# Patient Record
Sex: Male | Born: 1935 | Race: White | Hispanic: No | Marital: Married | Smoking: Never smoker
Health system: Southern US, Community
[De-identification: ages and names within clinical notes are randomized; demographics above are authoritative.]

## PROBLEM LIST (undated history)

## (undated) DIAGNOSIS — I10 Essential (primary) hypertension: Secondary | ICD-10-CM

## (undated) HISTORY — PX: PROSTATECTOMY: SHX69

## (undated) HISTORY — PX: LIGAMENT REPAIR: SHX5444

## (undated) HISTORY — PX: APPENDECTOMY: SHX54

## (undated) HISTORY — PX: TOTAL HIP ARTHROPLASTY: SHX124

## (undated) NOTE — *Deleted (*Deleted)
HEMATOLOGY/ONCOLOGY CLINIC NOTE  Date of Service: 11/28/2019  Patient Care Team: Maurice Small, MD as PCP - General (Family Medicine) Buford Dresser, MD as PCP - Cardiology (Cardiology)  CHIEF COMPLAINTS/PURPOSE OF CONSULTATION:   MDS Large renal cystic mass  HISTORY OF PRESENTING ILLNESS:  Joseph Leach is a wonderful 34 y.o. male who has been referred to [place] by [name], PA for evaluation and management of leukopenia w/blasts. Pt is accompanied today by his son. The pt reports that he is doing well overall.   The pt reports that he sees [name], [place] for his seronegative rheumatoid arthritis. The only change that he has noticed recently is an increase in intermittent diarrhea and constipation. His son notes that the pt has had issues with constipation for years, starting directly after an episode of Gout, but thinks that the constipation may have worsened over the last few months. Pt denies any recent infections or any use of antibiotics.   Pt takes low-dose Prednisone for his RA, Allopurinol for his Gout, Carvedilol for his HTN, and Eliquis. His Gout has been well-controlled and he has not required Colchicine. Pt has received Cortisone injections in his knees on multiple occasions and once in his right shoulder. Pt and his wife walk up to a quarter of a mile per day when the weather is good.   Most recent lab results (06/13/2019) of CBC is as follows: all values are WNL except for RBC at 3.81, Hgb at 12.3, HCT at 37.4, MCV at 98, Abs Immature Granulocytes at 0.4K.  On review of systems, pt reports constipation, diarrhea and denies fevers, chills, night sweats, back pain, new bone pain, headaches, abnormal/excessive bleeding and any other symptoms.   On PMHx the pt reports Osteoarthritis, Rheumatoid Arthritis, Gout, Stage 3 CKD, Elevated C-reactive protein, HTN, Prostate cancer.  INTERVAL HISTORY: I connected with  [name] on 11/28/19 by telephone and  verified that I am speaking with the correct person using two identifiers.   I discussed the limitations of evaluation and management by telemedicine. The patient expressed understanding and agreed to proceed.  Other persons participating in the visit and their role in the encounter: ***  Patient's location: *** Provider's location: ***  Joseph Leach is a wonderful 84 y.o. male who is here for evaluation and management of leukopenia w/blasts. The patient's last visit with [place] was on 11/05/2019. The pt reports that he is doing well overall.  The pt reports ***  Of note since the patient's last visit, pt has had *** completed on *** with results revealing ***.  Lab results today (11/28/19) of CBC w/diff and CMP is as follows: all values are WNL except for ***.  On review of systems, pt reports *** and denies ***and any other symptoms.   A&P: -Discussed pt labwork today, 11/28/19; *** -***   MEDICAL HISTORY:  Past Medical History:  Diagnosis Date  . Hypertension   . SCC (squamous cell carcinoma) 08/02/2019   RIGHT FOREARM  (TX WITH BX)  . SCC (squamous cell carcinoma) 08/02/2019   LEFT FOREARM  (TX WITH BX)  . Squamous cell carcinoma of skin 08/08/2017   in situ-left forearm (txpbx)  . Squamous cell carcinoma of skin 01/09/2019   in situ-left forearm (txpbx)  . Squamous cell carcinoma of skin 01/09/2019   in situ-right hand proximal (txpbx)  . Squamous cell carcinoma of skin 01/09/2019   in situ-right arm (txpbx)  Osteoarthritis Rheumatoid Arthritis Gout Stage 3 CKD Elevated  C-reactive protein Prostate Cancer   SURGICAL HISTORY: Past Surgical History:  Procedure Laterality Date  . APPENDECTOMY    . LIGAMENT REPAIR Right   . PROSTATECTOMY    . TOTAL HIP ARTHROPLASTY      SOCIAL HISTORY: Social History   Socioeconomic History  . Marital status: Married    Spouse name: Not on file  . Number of children: Not on file  . Years of education: Not on file  .  Highest education level: Not on file  Occupational History  . Not on file  Tobacco Use  . Smoking status: Never Smoker  . Smokeless tobacco: Never Used  Vaping Use  . Vaping Use: Never used  Substance and Sexual Activity  . Alcohol use: Not Currently    Comment: occasional  . Drug use: Never  . Sexual activity: Not on file  Other Topics Concern  . Not on file  Social History Narrative  . Not on file   Social Determinants of Health   Financial Resource Strain:   . Difficulty of Paying Living Expenses: Not on file  Food Insecurity:   . Worried About Charity fundraiser in the Last Year: Not on file  . Ran Out of Food in the Last Year: Not on file  Transportation Needs:   . Lack of Transportation (Medical): Not on file  . Lack of Transportation (Non-Medical): Not on file  Physical Activity:   . Days of Exercise per Week: Not on file  . Minutes of Exercise per Session: Not on file  Stress:   . Feeling of Stress : Not on file  Social Connections:   . Frequency of Communication with Friends and Family: Not on file  . Frequency of Social Gatherings with Friends and Family: Not on file  . Attends Religious Services: Not on file  . Active Member of Clubs or Organizations: Not on file  . Attends Archivist Meetings: Not on file  . Marital Status: Not on file  Intimate Partner Violence:   . Fear of Current or Ex-Partner: Not on file  . Emotionally Abused: Not on file  . Physically Abused: Not on file  . Sexually Abused: Not on file    FAMILY HISTORY: No family history on file.  ALLERGIES:  has No Known Allergies.  MEDICATIONS:  Current Outpatient Medications  Medication Sig Dispense Refill  . allopurinol (ZYLOPRIM) 100 MG tablet Take 100 mg by mouth 2 (two) times daily.    Marland Kitchen amLODipine (NORVASC) 5 MG tablet Take 1 tablet (5 mg total) by mouth daily. 90 tablet 3  . chlorthalidone (HYGROTON) 25 MG tablet Take 0.5 tablets (12.5 mg total) by mouth daily. 30 tablet  3  . colchicine 0.6 MG tablet Take 0.6 mg by mouth as needed. For gout flare    . diclofenac Sodium (VOLTAREN) 1 % GEL daily as needed.     . dicyclomine (BENTYL) 20 MG tablet     . DICYCLOMINE HCL IM 20 mg daily. 4 tablets daily    . predniSONE (DELTASONE) 1 MG tablet     . predniSONE (DELTASONE) 10 MG tablet Take 7.5 mg by mouth 2 (two) times daily with a meal.     . predniSONE (DELTASONE) 5 MG tablet      No current facility-administered medications for this visit.    REVIEW OF SYSTEMS:   A 10+ POINT REVIEW OF SYSTEMS WAS OBTAINED including neurology, dermatology, psychiatry, cardiac, respiratory, lymph, extremities, GI, GU, Musculoskeletal, constitutional, breasts, reproductive, HEENT.  All pertinent positives are noted in the HPI.  All others are negative.   PHYSICAL EXAMINATION: ECOG PERFORMANCE STATUS: 2 - Symptomatic, <50% confined to bed  . There were no vitals filed for this visit. There were no vitals filed for this visit. .There is no height or weight on file to calculate BMI.  *** GENERAL:alert, in no acute distress and comfortable SKIN: no acute rashes, no significant lesions EYES: conjunctiva are pink and non-injected, sclera anicteric OROPHARYNX: MMM, no exudates, no oropharyngeal erythema or ulceration NECK: supple, no JVD LYMPH:  no palpable lymphadenopathy in the cervical, axillary or inguinal regions LUNGS: clear to auscultation b/l with normal respiratory effort HEART: regular rate & rhythm ABDOMEN:  normoactive bowel sounds , non tender, not distended. No palpable hepatosplenomegaly.  Extremity: no pedal edema PSYCH: alert & oriented x 3 with fluent speech NEURO: no focal motor/sensory deficits  LABORATORY DATA:  I have reviewed the data as listed  . CBC Latest Ref Rng & Units 11/05/2019 09/04/2019 08/20/2019  WBC 4.0 - 10.5 K/uL 8.8 8.7 7.7  Hemoglobin 13.0 - 17.0 g/dL 10.4(L) 10.5(L) 10.0(L)  Hematocrit 39 - 52 % 34.6(L) 35.1(L) 33.3(L)  Platelets 150  - 400 K/uL 158 179 185   . CBC    Component Value Date/Time   WBC 8.8 11/05/2019 1011   RBC 3.69 (L) 11/05/2019 1011   HGB 10.4 (L) 11/05/2019 1011   HGB 10.5 (L) 09/04/2019 1441   HCT 34.6 (L) 11/05/2019 1011   PLT 158 11/05/2019 1011   PLT 179 09/04/2019 1441   MCV 93.8 11/05/2019 1011   MCH 28.2 11/05/2019 1011   MCHC 30.1 11/05/2019 1011   RDW 17.9 (H) 11/05/2019 1011   LYMPHSABS 1.5 11/05/2019 1011   MONOABS 0.7 11/05/2019 1011   EOSABS 0.0 11/05/2019 1011   BASOSABS 0.0 11/05/2019 1011    . CMP Latest Ref Rng & Units 11/05/2019 09/04/2019 08/20/2019  Glucose 70 - 99 mg/dL 96 109(H) 100(H)  BUN 8 - 23 mg/dL 39(H) 32(H) 33(H)  Creatinine 0.61 - 1.24 mg/dL 1.88(H) 1.51(H) 1.61(H)  Sodium 135 - 145 mmol/L 141 142 142  Potassium 3.5 - 5.1 mmol/L 4.5 4.4 4.0  Chloride 98 - 111 mmol/L 109 108 110  CO2 22 - 32 mmol/L 26 26 24   Calcium 8.9 - 10.3 mg/dL 10.2 10.1 10.1  Total Protein 6.5 - 8.1 g/dL 6.1(L) 6.2(L) 6.2(L)  Total Bilirubin 0.3 - 1.2 mg/dL 0.6 0.6 0.6  Alkaline Phos 38 - 126 U/L 75 78 84  AST 15 - 41 U/L 14(L) 13(L) 13(L)  ALT 0 - 44 U/L 14 11 13    08/02/2019 Dermatopathology [id number]):   07/25/2019 Bone Marrow Report ([id number]):   07/25/2019 Cytogenetics:    06/25/19 Myeloid Panel NGS:   RADIOGRAPHIC STUDIES: I have personally reviewed the radiological images as listed and agreed with the findings in the report. MYOCARDIAL PERFUSION IMAGING  Result Date: 11/20/2019  The left ventricular ejection fraction is normal (55-65%).  Nuclear stress EF: 56%.  There was no ST segment deviation noted during stress.  Defect 1: There is a small defect of mild severity present in the apical inferior location.  This is a low risk study. There is no significant ischemia identified.  Candee Furbish, MD   ASSESSMENT & PLAN:   56 yo with seronegative RA on prednisone with   1) Leucocytosis with immature granulocytic cells and few peripheral blasts 2)  macrocytosis Anemia 3) Large complex cystic rt renal mass -08/05/2019 CT Abd/Pel ([id number])  revealed "1. Large complex cystic right renal mass. Malignancy cannot be excluded. No evidence of metastatic disease. 2. Moderate pericardial effusion. 3. Areas of nodular subpleural consolidation in the left lower lobe may be infectious/inflammatory in etiology. 4. Small bilateral pleural effusions, right greater than left."  PLAN: *** -No lab or clinical evidence of MDS/MPN progression at this time. -Goal Ferritin >=250 due to CKD, would prefer Sat Ratios >= 30 -Advised pt that if Cardiology is interested in restarting pt on Eliquis would recommend 1/2 dose due to renal function and age.  -Will see back in 3 months with labs   FOLLOW UP: ***   The total time spent in the appt was *** minutes and more than 50% was on counseling and direct patient cares.  All of the patient's questions were answered with apparent satisfaction. The patient knows to call the clinic with any problems, questions or concerns.    Sullivan Lone MD [place] AAHIVMS [facility] Hematology/Oncology Physician [facility]  (Office):       [phone] (Work cell):  [phone] (Fax):           [phone]  11/28/2019 7:35 AM  I, Yevette Edwards, am acting as a scribe for Dr. Sullivan Lone.   {Add Barista Statement}

---

## 2009-05-07 ENCOUNTER — Emergency Department (HOSPITAL_COMMUNITY)
Admission: EM | Admit: 2009-05-07 | Discharge: 2009-05-07 | Payer: Self-pay | Source: Home / Self Care | Admitting: Family Medicine

## 2015-02-23 DIAGNOSIS — R112 Nausea with vomiting, unspecified: Secondary | ICD-10-CM | POA: Diagnosis not present

## 2015-02-23 DIAGNOSIS — R1013 Epigastric pain: Secondary | ICD-10-CM | POA: Diagnosis not present

## 2015-03-12 DIAGNOSIS — L259 Unspecified contact dermatitis, unspecified cause: Secondary | ICD-10-CM | POA: Diagnosis not present

## 2015-04-15 DIAGNOSIS — L309 Dermatitis, unspecified: Secondary | ICD-10-CM | POA: Diagnosis not present

## 2015-07-27 DIAGNOSIS — M25561 Pain in right knee: Secondary | ICD-10-CM | POA: Diagnosis not present

## 2015-07-27 DIAGNOSIS — E559 Vitamin D deficiency, unspecified: Secondary | ICD-10-CM | POA: Diagnosis not present

## 2015-07-27 DIAGNOSIS — E785 Hyperlipidemia, unspecified: Secondary | ICD-10-CM | POA: Diagnosis not present

## 2015-07-27 DIAGNOSIS — N183 Chronic kidney disease, stage 3 (moderate): Secondary | ICD-10-CM | POA: Diagnosis not present

## 2015-07-27 DIAGNOSIS — I10 Essential (primary) hypertension: Secondary | ICD-10-CM | POA: Diagnosis not present

## 2015-07-29 DIAGNOSIS — M25561 Pain in right knee: Secondary | ICD-10-CM | POA: Diagnosis not present

## 2015-07-29 DIAGNOSIS — N183 Chronic kidney disease, stage 3 (moderate): Secondary | ICD-10-CM | POA: Diagnosis not present

## 2015-07-29 DIAGNOSIS — E785 Hyperlipidemia, unspecified: Secondary | ICD-10-CM | POA: Diagnosis not present

## 2015-07-29 DIAGNOSIS — E559 Vitamin D deficiency, unspecified: Secondary | ICD-10-CM | POA: Diagnosis not present

## 2015-07-29 DIAGNOSIS — M1711 Unilateral primary osteoarthritis, right knee: Secondary | ICD-10-CM | POA: Diagnosis not present

## 2015-07-29 DIAGNOSIS — M25461 Effusion, right knee: Secondary | ICD-10-CM | POA: Diagnosis not present

## 2015-08-05 DIAGNOSIS — M25561 Pain in right knee: Secondary | ICD-10-CM | POA: Diagnosis not present

## 2015-08-05 DIAGNOSIS — Z5189 Encounter for other specified aftercare: Secondary | ICD-10-CM | POA: Diagnosis not present

## 2015-08-12 DIAGNOSIS — Z5189 Encounter for other specified aftercare: Secondary | ICD-10-CM | POA: Diagnosis not present

## 2015-08-12 DIAGNOSIS — M25561 Pain in right knee: Secondary | ICD-10-CM | POA: Diagnosis not present

## 2015-08-19 DIAGNOSIS — Z5189 Encounter for other specified aftercare: Secondary | ICD-10-CM | POA: Diagnosis not present

## 2015-08-19 DIAGNOSIS — M25561 Pain in right knee: Secondary | ICD-10-CM | POA: Diagnosis not present

## 2015-09-02 DIAGNOSIS — Z5189 Encounter for other specified aftercare: Secondary | ICD-10-CM | POA: Diagnosis not present

## 2015-09-02 DIAGNOSIS — M25561 Pain in right knee: Secondary | ICD-10-CM | POA: Diagnosis not present

## 2015-10-10 DIAGNOSIS — S6991XA Unspecified injury of right wrist, hand and finger(s), initial encounter: Secondary | ICD-10-CM | POA: Diagnosis not present

## 2015-10-10 DIAGNOSIS — S199XXA Unspecified injury of neck, initial encounter: Secondary | ICD-10-CM | POA: Diagnosis not present

## 2015-10-10 DIAGNOSIS — S63254A Unspecified dislocation of right ring finger, initial encounter: Secondary | ICD-10-CM | POA: Diagnosis not present

## 2015-10-10 DIAGNOSIS — S63294A Dislocation of distal interphalangeal joint of right ring finger, initial encounter: Secondary | ICD-10-CM | POA: Diagnosis not present

## 2015-10-10 DIAGNOSIS — S0990XA Unspecified injury of head, initial encounter: Secondary | ICD-10-CM | POA: Diagnosis not present

## 2015-10-21 DIAGNOSIS — H2513 Age-related nuclear cataract, bilateral: Secondary | ICD-10-CM | POA: Diagnosis not present

## 2015-10-23 DIAGNOSIS — M1711 Unilateral primary osteoarthritis, right knee: Secondary | ICD-10-CM | POA: Diagnosis not present

## 2015-10-23 DIAGNOSIS — M25561 Pain in right knee: Secondary | ICD-10-CM | POA: Diagnosis not present

## 2015-10-23 DIAGNOSIS — G8929 Other chronic pain: Secondary | ICD-10-CM | POA: Diagnosis not present

## 2015-10-29 DIAGNOSIS — Z23 Encounter for immunization: Secondary | ICD-10-CM | POA: Diagnosis not present

## 2015-11-02 DIAGNOSIS — M25561 Pain in right knee: Secondary | ICD-10-CM | POA: Diagnosis not present

## 2015-11-02 DIAGNOSIS — M1711 Unilateral primary osteoarthritis, right knee: Secondary | ICD-10-CM | POA: Diagnosis not present

## 2015-11-13 DIAGNOSIS — R066 Hiccough: Secondary | ICD-10-CM | POA: Diagnosis not present

## 2015-11-29 DIAGNOSIS — Z7982 Long term (current) use of aspirin: Secondary | ICD-10-CM | POA: Diagnosis not present

## 2015-11-29 DIAGNOSIS — I10 Essential (primary) hypertension: Secondary | ICD-10-CM | POA: Diagnosis not present

## 2015-11-29 DIAGNOSIS — I440 Atrioventricular block, first degree: Secondary | ICD-10-CM | POA: Diagnosis not present

## 2015-11-29 DIAGNOSIS — R0602 Shortness of breath: Secondary | ICD-10-CM | POA: Diagnosis not present

## 2015-11-29 DIAGNOSIS — R079 Chest pain, unspecified: Secondary | ICD-10-CM | POA: Diagnosis not present

## 2015-11-29 DIAGNOSIS — E785 Hyperlipidemia, unspecified: Secondary | ICD-10-CM | POA: Diagnosis not present

## 2015-11-29 DIAGNOSIS — R55 Syncope and collapse: Secondary | ICD-10-CM | POA: Diagnosis not present

## 2015-12-18 DIAGNOSIS — M1711 Unilateral primary osteoarthritis, right knee: Secondary | ICD-10-CM | POA: Diagnosis not present

## 2015-12-25 DIAGNOSIS — M1711 Unilateral primary osteoarthritis, right knee: Secondary | ICD-10-CM | POA: Diagnosis not present

## 2015-12-31 DIAGNOSIS — M1711 Unilateral primary osteoarthritis, right knee: Secondary | ICD-10-CM | POA: Diagnosis not present

## 2015-12-31 DIAGNOSIS — M25561 Pain in right knee: Secondary | ICD-10-CM | POA: Diagnosis not present

## 2016-01-07 DIAGNOSIS — M25519 Pain in unspecified shoulder: Secondary | ICD-10-CM | POA: Diagnosis not present

## 2016-01-07 DIAGNOSIS — M25511 Pain in right shoulder: Secondary | ICD-10-CM | POA: Diagnosis not present

## 2016-01-09 DIAGNOSIS — M25511 Pain in right shoulder: Secondary | ICD-10-CM | POA: Diagnosis not present

## 2016-01-09 DIAGNOSIS — G8929 Other chronic pain: Secondary | ICD-10-CM | POA: Diagnosis not present

## 2016-01-13 DIAGNOSIS — M7730 Calcaneal spur, unspecified foot: Secondary | ICD-10-CM | POA: Diagnosis not present

## 2016-01-13 DIAGNOSIS — M7989 Other specified soft tissue disorders: Secondary | ICD-10-CM | POA: Diagnosis not present

## 2016-01-13 DIAGNOSIS — I10 Essential (primary) hypertension: Secondary | ICD-10-CM | POA: Diagnosis not present

## 2016-01-13 DIAGNOSIS — L03116 Cellulitis of left lower limb: Secondary | ICD-10-CM | POA: Diagnosis not present

## 2016-01-13 DIAGNOSIS — E785 Hyperlipidemia, unspecified: Secondary | ICD-10-CM | POA: Diagnosis not present

## 2016-01-19 DIAGNOSIS — M1711 Unilateral primary osteoarthritis, right knee: Secondary | ICD-10-CM | POA: Diagnosis not present

## 2016-01-19 DIAGNOSIS — M6281 Muscle weakness (generalized): Secondary | ICD-10-CM | POA: Diagnosis not present

## 2016-01-19 DIAGNOSIS — I440 Atrioventricular block, first degree: Secondary | ICD-10-CM | POA: Diagnosis not present

## 2016-01-19 DIAGNOSIS — M25461 Effusion, right knee: Secondary | ICD-10-CM | POA: Diagnosis not present

## 2016-01-19 DIAGNOSIS — R531 Weakness: Secondary | ICD-10-CM | POA: Diagnosis not present

## 2016-01-19 DIAGNOSIS — M79605 Pain in left leg: Secondary | ICD-10-CM | POA: Diagnosis not present

## 2016-01-19 DIAGNOSIS — M7989 Other specified soft tissue disorders: Secondary | ICD-10-CM | POA: Diagnosis not present

## 2016-01-19 DIAGNOSIS — I10 Essential (primary) hypertension: Secondary | ICD-10-CM | POA: Diagnosis not present

## 2016-01-19 DIAGNOSIS — D649 Anemia, unspecified: Secondary | ICD-10-CM | POA: Diagnosis not present

## 2016-01-19 DIAGNOSIS — M25462 Effusion, left knee: Secondary | ICD-10-CM | POA: Diagnosis not present

## 2016-01-19 DIAGNOSIS — E785 Hyperlipidemia, unspecified: Secondary | ICD-10-CM | POA: Diagnosis not present

## 2016-01-19 DIAGNOSIS — M79661 Pain in right lower leg: Secondary | ICD-10-CM | POA: Diagnosis not present

## 2016-01-19 DIAGNOSIS — M79604 Pain in right leg: Secondary | ICD-10-CM | POA: Diagnosis not present

## 2016-01-19 DIAGNOSIS — M79662 Pain in left lower leg: Secondary | ICD-10-CM | POA: Diagnosis not present

## 2016-01-19 DIAGNOSIS — N183 Chronic kidney disease, stage 3 (moderate): Secondary | ICD-10-CM | POA: Diagnosis not present

## 2016-01-19 DIAGNOSIS — R600 Localized edema: Secondary | ICD-10-CM | POA: Diagnosis not present

## 2016-01-19 DIAGNOSIS — M1712 Unilateral primary osteoarthritis, left knee: Secondary | ICD-10-CM | POA: Diagnosis not present

## 2016-01-20 DIAGNOSIS — N183 Chronic kidney disease, stage 3 (moderate): Secondary | ICD-10-CM | POA: Diagnosis not present

## 2016-01-20 DIAGNOSIS — E785 Hyperlipidemia, unspecified: Secondary | ICD-10-CM | POA: Diagnosis not present

## 2016-01-20 DIAGNOSIS — M79605 Pain in left leg: Secondary | ICD-10-CM | POA: Diagnosis not present

## 2016-01-20 DIAGNOSIS — I10 Essential (primary) hypertension: Secondary | ICD-10-CM | POA: Diagnosis not present

## 2016-01-20 DIAGNOSIS — M79604 Pain in right leg: Secondary | ICD-10-CM | POA: Diagnosis not present

## 2016-01-20 DIAGNOSIS — N179 Acute kidney failure, unspecified: Secondary | ICD-10-CM | POA: Diagnosis not present

## 2016-01-20 DIAGNOSIS — D649 Anemia, unspecified: Secondary | ICD-10-CM | POA: Diagnosis not present

## 2016-01-21 DIAGNOSIS — M25561 Pain in right knee: Secondary | ICD-10-CM | POA: Diagnosis not present

## 2016-01-21 DIAGNOSIS — R29898 Other symptoms and signs involving the musculoskeletal system: Secondary | ICD-10-CM | POA: Diagnosis not present

## 2016-01-21 DIAGNOSIS — R32 Unspecified urinary incontinence: Secondary | ICD-10-CM | POA: Diagnosis not present

## 2016-01-21 DIAGNOSIS — D649 Anemia, unspecified: Secondary | ICD-10-CM | POA: Diagnosis not present

## 2016-01-21 DIAGNOSIS — N179 Acute kidney failure, unspecified: Secondary | ICD-10-CM | POA: Diagnosis not present

## 2016-01-21 DIAGNOSIS — M25569 Pain in unspecified knee: Secondary | ICD-10-CM | POA: Diagnosis not present

## 2016-01-21 DIAGNOSIS — L03116 Cellulitis of left lower limb: Secondary | ICD-10-CM | POA: Diagnosis present

## 2016-01-21 DIAGNOSIS — M1711 Unilateral primary osteoarthritis, right knee: Secondary | ICD-10-CM | POA: Diagnosis not present

## 2016-01-21 DIAGNOSIS — E785 Hyperlipidemia, unspecified: Secondary | ICD-10-CM | POA: Diagnosis not present

## 2016-01-21 DIAGNOSIS — M6281 Muscle weakness (generalized): Secondary | ICD-10-CM | POA: Diagnosis present

## 2016-01-21 DIAGNOSIS — N183 Chronic kidney disease, stage 3 (moderate): Secondary | ICD-10-CM | POA: Diagnosis not present

## 2016-01-21 DIAGNOSIS — Z96649 Presence of unspecified artificial hip joint: Secondary | ICD-10-CM | POA: Diagnosis not present

## 2016-01-21 DIAGNOSIS — M109 Gout, unspecified: Secondary | ICD-10-CM | POA: Diagnosis present

## 2016-01-21 DIAGNOSIS — I10 Essential (primary) hypertension: Secondary | ICD-10-CM | POA: Diagnosis not present

## 2016-01-21 DIAGNOSIS — R351 Nocturia: Secondary | ICD-10-CM | POA: Diagnosis not present

## 2016-01-21 DIAGNOSIS — I129 Hypertensive chronic kidney disease with stage 1 through stage 4 chronic kidney disease, or unspecified chronic kidney disease: Secondary | ICD-10-CM | POA: Diagnosis present

## 2016-01-21 DIAGNOSIS — R41841 Cognitive communication deficit: Secondary | ICD-10-CM | POA: Diagnosis not present

## 2016-01-21 DIAGNOSIS — D509 Iron deficiency anemia, unspecified: Secondary | ICD-10-CM | POA: Diagnosis not present

## 2016-01-21 DIAGNOSIS — Z96641 Presence of right artificial hip joint: Secondary | ICD-10-CM | POA: Diagnosis present

## 2016-01-21 DIAGNOSIS — M170 Bilateral primary osteoarthritis of knee: Secondary | ICD-10-CM | POA: Diagnosis present

## 2016-01-21 DIAGNOSIS — D631 Anemia in chronic kidney disease: Secondary | ICD-10-CM | POA: Diagnosis present

## 2016-01-21 DIAGNOSIS — M24561 Contracture, right knee: Secondary | ICD-10-CM | POA: Diagnosis present

## 2016-01-21 DIAGNOSIS — M1A00X0 Idiopathic chronic gout, unspecified site, without tophus (tophi): Secondary | ICD-10-CM | POA: Diagnosis not present

## 2016-01-21 DIAGNOSIS — Z8546 Personal history of malignant neoplasm of prostate: Secondary | ICD-10-CM | POA: Diagnosis not present

## 2016-01-21 DIAGNOSIS — L309 Dermatitis, unspecified: Secondary | ICD-10-CM | POA: Diagnosis present

## 2016-01-21 DIAGNOSIS — M79605 Pain in left leg: Secondary | ICD-10-CM | POA: Diagnosis not present

## 2016-01-21 DIAGNOSIS — E559 Vitamin D deficiency, unspecified: Secondary | ICD-10-CM | POA: Diagnosis present

## 2016-01-21 DIAGNOSIS — M1000 Idiopathic gout, unspecified site: Secondary | ICD-10-CM | POA: Diagnosis not present

## 2016-01-21 DIAGNOSIS — R262 Difficulty in walking, not elsewhere classified: Secondary | ICD-10-CM | POA: Diagnosis not present

## 2016-01-21 DIAGNOSIS — M79604 Pain in right leg: Secondary | ICD-10-CM | POA: Diagnosis not present

## 2016-01-25 DIAGNOSIS — Z8546 Personal history of malignant neoplasm of prostate: Secondary | ICD-10-CM | POA: Diagnosis not present

## 2016-01-25 DIAGNOSIS — M25561 Pain in right knee: Secondary | ICD-10-CM | POA: Diagnosis not present

## 2016-01-25 DIAGNOSIS — M1711 Unilateral primary osteoarthritis, right knee: Secondary | ICD-10-CM | POA: Diagnosis not present

## 2016-01-25 DIAGNOSIS — D509 Iron deficiency anemia, unspecified: Secondary | ICD-10-CM | POA: Diagnosis not present

## 2016-01-25 DIAGNOSIS — Z7409 Other reduced mobility: Secondary | ICD-10-CM | POA: Diagnosis not present

## 2016-01-25 DIAGNOSIS — M6281 Muscle weakness (generalized): Secondary | ICD-10-CM | POA: Diagnosis not present

## 2016-01-25 DIAGNOSIS — D631 Anemia in chronic kidney disease: Secondary | ICD-10-CM | POA: Diagnosis not present

## 2016-01-25 DIAGNOSIS — E559 Vitamin D deficiency, unspecified: Secondary | ICD-10-CM | POA: Diagnosis not present

## 2016-01-25 DIAGNOSIS — R32 Unspecified urinary incontinence: Secondary | ICD-10-CM | POA: Diagnosis not present

## 2016-01-25 DIAGNOSIS — M1A00X0 Idiopathic chronic gout, unspecified site, without tophus (tophi): Secondary | ICD-10-CM | POA: Diagnosis not present

## 2016-01-25 DIAGNOSIS — N183 Chronic kidney disease, stage 3 (moderate): Secondary | ICD-10-CM | POA: Diagnosis not present

## 2016-01-25 DIAGNOSIS — M1A9XX1 Chronic gout, unspecified, with tophus (tophi): Secondary | ICD-10-CM | POA: Diagnosis not present

## 2016-01-25 DIAGNOSIS — R29898 Other symptoms and signs involving the musculoskeletal system: Secondary | ICD-10-CM | POA: Diagnosis not present

## 2016-01-25 DIAGNOSIS — R609 Edema, unspecified: Secondary | ICD-10-CM | POA: Diagnosis not present

## 2016-01-25 DIAGNOSIS — I129 Hypertensive chronic kidney disease with stage 1 through stage 4 chronic kidney disease, or unspecified chronic kidney disease: Secondary | ICD-10-CM | POA: Diagnosis not present

## 2016-01-25 DIAGNOSIS — M1000 Idiopathic gout, unspecified site: Secondary | ICD-10-CM | POA: Diagnosis not present

## 2016-01-25 DIAGNOSIS — R262 Difficulty in walking, not elsewhere classified: Secondary | ICD-10-CM | POA: Diagnosis not present

## 2016-01-25 DIAGNOSIS — R2681 Unsteadiness on feet: Secondary | ICD-10-CM | POA: Diagnosis not present

## 2016-01-25 DIAGNOSIS — R41841 Cognitive communication deficit: Secondary | ICD-10-CM | POA: Diagnosis not present

## 2016-01-25 DIAGNOSIS — Z96649 Presence of unspecified artificial hip joint: Secondary | ICD-10-CM | POA: Diagnosis not present

## 2016-01-25 DIAGNOSIS — I10 Essential (primary) hypertension: Secondary | ICD-10-CM | POA: Diagnosis not present

## 2016-01-25 DIAGNOSIS — E785 Hyperlipidemia, unspecified: Secondary | ICD-10-CM | POA: Diagnosis not present

## 2016-01-25 DIAGNOSIS — G8929 Other chronic pain: Secondary | ICD-10-CM | POA: Diagnosis not present

## 2016-01-25 DIAGNOSIS — M79605 Pain in left leg: Secondary | ICD-10-CM | POA: Diagnosis not present

## 2016-01-25 DIAGNOSIS — L089 Local infection of the skin and subcutaneous tissue, unspecified: Secondary | ICD-10-CM | POA: Diagnosis not present

## 2016-01-25 DIAGNOSIS — M170 Bilateral primary osteoarthritis of knee: Secondary | ICD-10-CM | POA: Diagnosis not present

## 2016-01-25 DIAGNOSIS — M25569 Pain in unspecified knee: Secondary | ICD-10-CM | POA: Diagnosis not present

## 2016-01-25 DIAGNOSIS — B9689 Other specified bacterial agents as the cause of diseases classified elsewhere: Secondary | ICD-10-CM | POA: Diagnosis not present

## 2016-01-25 DIAGNOSIS — N179 Acute kidney failure, unspecified: Secondary | ICD-10-CM | POA: Diagnosis not present

## 2016-01-25 DIAGNOSIS — D649 Anemia, unspecified: Secondary | ICD-10-CM | POA: Diagnosis not present

## 2016-01-25 DIAGNOSIS — M79604 Pain in right leg: Secondary | ICD-10-CM | POA: Diagnosis not present

## 2016-01-26 DIAGNOSIS — G8929 Other chronic pain: Secondary | ICD-10-CM | POA: Diagnosis not present

## 2016-01-26 DIAGNOSIS — E785 Hyperlipidemia, unspecified: Secondary | ICD-10-CM | POA: Diagnosis not present

## 2016-01-26 DIAGNOSIS — M25561 Pain in right knee: Secondary | ICD-10-CM | POA: Diagnosis not present

## 2016-01-26 DIAGNOSIS — N179 Acute kidney failure, unspecified: Secondary | ICD-10-CM | POA: Diagnosis not present

## 2016-01-26 DIAGNOSIS — R2681 Unsteadiness on feet: Secondary | ICD-10-CM | POA: Diagnosis not present

## 2016-01-26 DIAGNOSIS — N183 Chronic kidney disease, stage 3 (moderate): Secondary | ICD-10-CM | POA: Diagnosis not present

## 2016-01-26 DIAGNOSIS — D631 Anemia in chronic kidney disease: Secondary | ICD-10-CM | POA: Diagnosis not present

## 2016-01-26 DIAGNOSIS — Z7409 Other reduced mobility: Secondary | ICD-10-CM | POA: Diagnosis not present

## 2016-01-26 DIAGNOSIS — M1711 Unilateral primary osteoarthritis, right knee: Secondary | ICD-10-CM | POA: Diagnosis not present

## 2016-01-26 DIAGNOSIS — M1000 Idiopathic gout, unspecified site: Secondary | ICD-10-CM | POA: Diagnosis not present

## 2016-01-26 DIAGNOSIS — I129 Hypertensive chronic kidney disease with stage 1 through stage 4 chronic kidney disease, or unspecified chronic kidney disease: Secondary | ICD-10-CM | POA: Diagnosis not present

## 2016-02-02 DIAGNOSIS — R609 Edema, unspecified: Secondary | ICD-10-CM | POA: Diagnosis not present

## 2016-02-02 DIAGNOSIS — N183 Chronic kidney disease, stage 3 (moderate): Secondary | ICD-10-CM | POA: Diagnosis not present

## 2016-02-02 DIAGNOSIS — D631 Anemia in chronic kidney disease: Secondary | ICD-10-CM | POA: Diagnosis not present

## 2016-02-02 DIAGNOSIS — M1000 Idiopathic gout, unspecified site: Secondary | ICD-10-CM | POA: Diagnosis not present

## 2016-02-02 DIAGNOSIS — M1711 Unilateral primary osteoarthritis, right knee: Secondary | ICD-10-CM | POA: Diagnosis not present

## 2016-02-12 DIAGNOSIS — M1000 Idiopathic gout, unspecified site: Secondary | ICD-10-CM | POA: Diagnosis not present

## 2016-02-12 DIAGNOSIS — B9689 Other specified bacterial agents as the cause of diseases classified elsewhere: Secondary | ICD-10-CM | POA: Diagnosis not present

## 2016-02-12 DIAGNOSIS — M1A9XX1 Chronic gout, unspecified, with tophus (tophi): Secondary | ICD-10-CM | POA: Diagnosis not present

## 2016-02-12 DIAGNOSIS — N183 Chronic kidney disease, stage 3 (moderate): Secondary | ICD-10-CM | POA: Diagnosis not present

## 2016-02-12 DIAGNOSIS — L089 Local infection of the skin and subcutaneous tissue, unspecified: Secondary | ICD-10-CM | POA: Diagnosis not present

## 2016-02-16 DIAGNOSIS — M1000 Idiopathic gout, unspecified site: Secondary | ICD-10-CM | POA: Diagnosis not present

## 2016-02-16 DIAGNOSIS — M19042 Primary osteoarthritis, left hand: Secondary | ICD-10-CM | POA: Diagnosis not present

## 2016-02-17 DIAGNOSIS — M170 Bilateral primary osteoarthritis of knee: Secondary | ICD-10-CM | POA: Diagnosis not present

## 2016-02-18 DIAGNOSIS — M150 Primary generalized (osteo)arthritis: Secondary | ICD-10-CM | POA: Diagnosis not present

## 2016-02-18 DIAGNOSIS — M25561 Pain in right knee: Secondary | ICD-10-CM | POA: Diagnosis not present

## 2016-02-18 DIAGNOSIS — R269 Unspecified abnormalities of gait and mobility: Secondary | ICD-10-CM | POA: Diagnosis not present

## 2016-02-18 DIAGNOSIS — M6281 Muscle weakness (generalized): Secondary | ICD-10-CM | POA: Diagnosis not present

## 2016-02-23 DIAGNOSIS — M25561 Pain in right knee: Secondary | ICD-10-CM | POA: Diagnosis not present

## 2016-02-23 DIAGNOSIS — R269 Unspecified abnormalities of gait and mobility: Secondary | ICD-10-CM | POA: Diagnosis not present

## 2016-02-23 DIAGNOSIS — M150 Primary generalized (osteo)arthritis: Secondary | ICD-10-CM | POA: Diagnosis not present

## 2016-02-23 DIAGNOSIS — M6281 Muscle weakness (generalized): Secondary | ICD-10-CM | POA: Diagnosis not present

## 2016-02-25 DIAGNOSIS — M6281 Muscle weakness (generalized): Secondary | ICD-10-CM | POA: Diagnosis not present

## 2016-02-25 DIAGNOSIS — M150 Primary generalized (osteo)arthritis: Secondary | ICD-10-CM | POA: Diagnosis not present

## 2016-02-25 DIAGNOSIS — M25561 Pain in right knee: Secondary | ICD-10-CM | POA: Diagnosis not present

## 2016-02-25 DIAGNOSIS — R269 Unspecified abnormalities of gait and mobility: Secondary | ICD-10-CM | POA: Diagnosis not present

## 2016-02-26 DIAGNOSIS — M25561 Pain in right knee: Secondary | ICD-10-CM | POA: Diagnosis not present

## 2016-02-26 DIAGNOSIS — G8929 Other chronic pain: Secondary | ICD-10-CM | POA: Diagnosis not present

## 2016-02-29 DIAGNOSIS — M25561 Pain in right knee: Secondary | ICD-10-CM | POA: Diagnosis not present

## 2016-03-02 DIAGNOSIS — M6281 Muscle weakness (generalized): Secondary | ICD-10-CM | POA: Diagnosis not present

## 2016-03-02 DIAGNOSIS — M25561 Pain in right knee: Secondary | ICD-10-CM | POA: Diagnosis not present

## 2016-03-02 DIAGNOSIS — R269 Unspecified abnormalities of gait and mobility: Secondary | ICD-10-CM | POA: Diagnosis not present

## 2016-03-02 DIAGNOSIS — M150 Primary generalized (osteo)arthritis: Secondary | ICD-10-CM | POA: Diagnosis not present

## 2016-03-03 DIAGNOSIS — M150 Primary generalized (osteo)arthritis: Secondary | ICD-10-CM | POA: Diagnosis not present

## 2016-03-03 DIAGNOSIS — M25561 Pain in right knee: Secondary | ICD-10-CM | POA: Diagnosis not present

## 2016-03-03 DIAGNOSIS — R269 Unspecified abnormalities of gait and mobility: Secondary | ICD-10-CM | POA: Diagnosis not present

## 2016-03-03 DIAGNOSIS — M6281 Muscle weakness (generalized): Secondary | ICD-10-CM | POA: Diagnosis not present

## 2016-03-07 DIAGNOSIS — R269 Unspecified abnormalities of gait and mobility: Secondary | ICD-10-CM | POA: Diagnosis not present

## 2016-03-07 DIAGNOSIS — M25561 Pain in right knee: Secondary | ICD-10-CM | POA: Diagnosis not present

## 2016-03-07 DIAGNOSIS — M6281 Muscle weakness (generalized): Secondary | ICD-10-CM | POA: Diagnosis not present

## 2016-03-07 DIAGNOSIS — M150 Primary generalized (osteo)arthritis: Secondary | ICD-10-CM | POA: Diagnosis not present

## 2016-03-08 DIAGNOSIS — M1A0490 Idiopathic chronic gout, unspecified hand, without tophus (tophi): Secondary | ICD-10-CM | POA: Diagnosis not present

## 2016-03-09 DIAGNOSIS — M10071 Idiopathic gout, right ankle and foot: Secondary | ICD-10-CM | POA: Diagnosis not present

## 2016-03-09 DIAGNOSIS — M10072 Idiopathic gout, left ankle and foot: Secondary | ICD-10-CM | POA: Diagnosis not present

## 2016-03-09 DIAGNOSIS — M1000 Idiopathic gout, unspecified site: Secondary | ICD-10-CM | POA: Diagnosis not present

## 2016-03-09 DIAGNOSIS — M1A9XX1 Chronic gout, unspecified, with tophus (tophi): Secondary | ICD-10-CM | POA: Diagnosis not present

## 2016-03-16 DIAGNOSIS — M6281 Muscle weakness (generalized): Secondary | ICD-10-CM | POA: Diagnosis not present

## 2016-03-16 DIAGNOSIS — M150 Primary generalized (osteo)arthritis: Secondary | ICD-10-CM | POA: Diagnosis not present

## 2016-03-16 DIAGNOSIS — M25561 Pain in right knee: Secondary | ICD-10-CM | POA: Diagnosis not present

## 2016-03-16 DIAGNOSIS — R269 Unspecified abnormalities of gait and mobility: Secondary | ICD-10-CM | POA: Diagnosis not present

## 2016-03-18 DIAGNOSIS — M6281 Muscle weakness (generalized): Secondary | ICD-10-CM | POA: Diagnosis not present

## 2016-03-18 DIAGNOSIS — M25561 Pain in right knee: Secondary | ICD-10-CM | POA: Diagnosis not present

## 2016-03-18 DIAGNOSIS — M150 Primary generalized (osteo)arthritis: Secondary | ICD-10-CM | POA: Diagnosis not present

## 2016-03-18 DIAGNOSIS — R269 Unspecified abnormalities of gait and mobility: Secondary | ICD-10-CM | POA: Diagnosis not present

## 2016-03-23 DIAGNOSIS — R269 Unspecified abnormalities of gait and mobility: Secondary | ICD-10-CM | POA: Diagnosis not present

## 2016-03-23 DIAGNOSIS — M25561 Pain in right knee: Secondary | ICD-10-CM | POA: Diagnosis not present

## 2016-03-23 DIAGNOSIS — M150 Primary generalized (osteo)arthritis: Secondary | ICD-10-CM | POA: Diagnosis not present

## 2016-03-23 DIAGNOSIS — M6281 Muscle weakness (generalized): Secondary | ICD-10-CM | POA: Diagnosis not present

## 2016-03-25 DIAGNOSIS — M150 Primary generalized (osteo)arthritis: Secondary | ICD-10-CM | POA: Diagnosis not present

## 2016-03-25 DIAGNOSIS — M1A9XX1 Chronic gout, unspecified, with tophus (tophi): Secondary | ICD-10-CM | POA: Diagnosis not present

## 2016-03-25 DIAGNOSIS — L853 Xerosis cutis: Secondary | ICD-10-CM | POA: Diagnosis not present

## 2016-03-25 DIAGNOSIS — R269 Unspecified abnormalities of gait and mobility: Secondary | ICD-10-CM | POA: Diagnosis not present

## 2016-03-25 DIAGNOSIS — M25561 Pain in right knee: Secondary | ICD-10-CM | POA: Diagnosis not present

## 2016-03-25 DIAGNOSIS — M6281 Muscle weakness (generalized): Secondary | ICD-10-CM | POA: Diagnosis not present

## 2016-03-29 DIAGNOSIS — M6281 Muscle weakness (generalized): Secondary | ICD-10-CM | POA: Diagnosis not present

## 2016-03-29 DIAGNOSIS — M25561 Pain in right knee: Secondary | ICD-10-CM | POA: Diagnosis not present

## 2016-03-29 DIAGNOSIS — M150 Primary generalized (osteo)arthritis: Secondary | ICD-10-CM | POA: Diagnosis not present

## 2016-03-29 DIAGNOSIS — R269 Unspecified abnormalities of gait and mobility: Secondary | ICD-10-CM | POA: Diagnosis not present

## 2016-03-31 DIAGNOSIS — M25561 Pain in right knee: Secondary | ICD-10-CM | POA: Diagnosis not present

## 2016-03-31 DIAGNOSIS — M150 Primary generalized (osteo)arthritis: Secondary | ICD-10-CM | POA: Diagnosis not present

## 2016-03-31 DIAGNOSIS — M6281 Muscle weakness (generalized): Secondary | ICD-10-CM | POA: Diagnosis not present

## 2016-03-31 DIAGNOSIS — R269 Unspecified abnormalities of gait and mobility: Secondary | ICD-10-CM | POA: Diagnosis not present

## 2016-04-01 DIAGNOSIS — M1000 Idiopathic gout, unspecified site: Secondary | ICD-10-CM | POA: Diagnosis not present

## 2016-05-18 DIAGNOSIS — Z8546 Personal history of malignant neoplasm of prostate: Secondary | ICD-10-CM | POA: Diagnosis not present

## 2016-05-18 DIAGNOSIS — Z5181 Encounter for therapeutic drug level monitoring: Secondary | ICD-10-CM | POA: Diagnosis not present

## 2016-05-18 DIAGNOSIS — M109 Gout, unspecified: Secondary | ICD-10-CM | POA: Diagnosis not present

## 2016-05-18 DIAGNOSIS — I10 Essential (primary) hypertension: Secondary | ICD-10-CM | POA: Diagnosis not present

## 2016-05-18 DIAGNOSIS — E782 Mixed hyperlipidemia: Secondary | ICD-10-CM | POA: Diagnosis not present

## 2016-06-21 DIAGNOSIS — M170 Bilateral primary osteoarthritis of knee: Secondary | ICD-10-CM | POA: Diagnosis not present

## 2016-06-21 DIAGNOSIS — I10 Essential (primary) hypertension: Secondary | ICD-10-CM | POA: Diagnosis not present

## 2016-06-21 DIAGNOSIS — Z0000 Encounter for general adult medical examination without abnormal findings: Secondary | ICD-10-CM | POA: Diagnosis not present

## 2016-06-21 DIAGNOSIS — E782 Mixed hyperlipidemia: Secondary | ICD-10-CM | POA: Diagnosis not present

## 2016-06-21 DIAGNOSIS — M109 Gout, unspecified: Secondary | ICD-10-CM | POA: Diagnosis not present

## 2016-06-22 DIAGNOSIS — M1712 Unilateral primary osteoarthritis, left knee: Secondary | ICD-10-CM | POA: Diagnosis not present

## 2016-06-22 DIAGNOSIS — M170 Bilateral primary osteoarthritis of knee: Secondary | ICD-10-CM | POA: Diagnosis not present

## 2016-06-22 DIAGNOSIS — M1711 Unilateral primary osteoarthritis, right knee: Secondary | ICD-10-CM | POA: Diagnosis not present

## 2016-08-05 DIAGNOSIS — M170 Bilateral primary osteoarthritis of knee: Secondary | ICD-10-CM | POA: Diagnosis not present

## 2016-08-12 DIAGNOSIS — M170 Bilateral primary osteoarthritis of knee: Secondary | ICD-10-CM | POA: Diagnosis not present

## 2016-08-18 DIAGNOSIS — M170 Bilateral primary osteoarthritis of knee: Secondary | ICD-10-CM | POA: Diagnosis not present

## 2016-09-29 DIAGNOSIS — M1712 Unilateral primary osteoarthritis, left knee: Secondary | ICD-10-CM | POA: Diagnosis not present

## 2016-09-29 DIAGNOSIS — M1711 Unilateral primary osteoarthritis, right knee: Secondary | ICD-10-CM | POA: Diagnosis not present

## 2016-11-11 DIAGNOSIS — M1990 Unspecified osteoarthritis, unspecified site: Secondary | ICD-10-CM | POA: Diagnosis not present

## 2016-11-11 DIAGNOSIS — M170 Bilateral primary osteoarthritis of knee: Secondary | ICD-10-CM | POA: Diagnosis not present

## 2016-11-14 DIAGNOSIS — L89312 Pressure ulcer of right buttock, stage 2: Secondary | ICD-10-CM | POA: Diagnosis not present

## 2016-11-14 DIAGNOSIS — Z23 Encounter for immunization: Secondary | ICD-10-CM | POA: Diagnosis not present

## 2016-11-25 DIAGNOSIS — M170 Bilateral primary osteoarthritis of knee: Secondary | ICD-10-CM | POA: Diagnosis not present

## 2016-11-25 DIAGNOSIS — M25561 Pain in right knee: Secondary | ICD-10-CM | POA: Diagnosis not present

## 2016-11-25 DIAGNOSIS — M25562 Pain in left knee: Secondary | ICD-10-CM | POA: Diagnosis not present

## 2016-11-25 DIAGNOSIS — G8929 Other chronic pain: Secondary | ICD-10-CM | POA: Diagnosis not present

## 2017-01-24 DIAGNOSIS — Z6827 Body mass index (BMI) 27.0-27.9, adult: Secondary | ICD-10-CM | POA: Diagnosis not present

## 2017-01-24 DIAGNOSIS — M109 Gout, unspecified: Secondary | ICD-10-CM | POA: Diagnosis not present

## 2017-01-24 DIAGNOSIS — R5383 Other fatigue: Secondary | ICD-10-CM | POA: Diagnosis not present

## 2017-01-24 DIAGNOSIS — E663 Overweight: Secondary | ICD-10-CM | POA: Diagnosis not present

## 2017-01-24 DIAGNOSIS — R700 Elevated erythrocyte sedimentation rate: Secondary | ICD-10-CM | POA: Diagnosis not present

## 2017-01-24 DIAGNOSIS — M2550 Pain in unspecified joint: Secondary | ICD-10-CM | POA: Diagnosis not present

## 2017-02-07 DIAGNOSIS — M109 Gout, unspecified: Secondary | ICD-10-CM | POA: Diagnosis not present

## 2017-02-07 DIAGNOSIS — M2550 Pain in unspecified joint: Secondary | ICD-10-CM | POA: Diagnosis not present

## 2017-02-07 DIAGNOSIS — E663 Overweight: Secondary | ICD-10-CM | POA: Diagnosis not present

## 2017-02-07 DIAGNOSIS — R700 Elevated erythrocyte sedimentation rate: Secondary | ICD-10-CM | POA: Diagnosis not present

## 2017-02-07 DIAGNOSIS — D649 Anemia, unspecified: Secondary | ICD-10-CM | POA: Diagnosis not present

## 2017-02-07 DIAGNOSIS — Z6827 Body mass index (BMI) 27.0-27.9, adult: Secondary | ICD-10-CM | POA: Diagnosis not present

## 2017-02-08 DIAGNOSIS — M2550 Pain in unspecified joint: Secondary | ICD-10-CM | POA: Diagnosis not present

## 2017-02-22 DIAGNOSIS — M170 Bilateral primary osteoarthritis of knee: Secondary | ICD-10-CM | POA: Diagnosis not present

## 2017-03-01 DIAGNOSIS — M170 Bilateral primary osteoarthritis of knee: Secondary | ICD-10-CM | POA: Diagnosis not present

## 2017-03-08 DIAGNOSIS — M170 Bilateral primary osteoarthritis of knee: Secondary | ICD-10-CM | POA: Diagnosis not present

## 2017-03-27 DIAGNOSIS — R700 Elevated erythrocyte sedimentation rate: Secondary | ICD-10-CM | POA: Diagnosis not present

## 2017-03-27 DIAGNOSIS — E663 Overweight: Secondary | ICD-10-CM | POA: Diagnosis not present

## 2017-03-27 DIAGNOSIS — D649 Anemia, unspecified: Secondary | ICD-10-CM | POA: Diagnosis not present

## 2017-03-27 DIAGNOSIS — M109 Gout, unspecified: Secondary | ICD-10-CM | POA: Diagnosis not present

## 2017-03-27 DIAGNOSIS — M2550 Pain in unspecified joint: Secondary | ICD-10-CM | POA: Diagnosis not present

## 2017-03-27 DIAGNOSIS — Z6827 Body mass index (BMI) 27.0-27.9, adult: Secondary | ICD-10-CM | POA: Diagnosis not present

## 2017-03-27 DIAGNOSIS — M150 Primary generalized (osteo)arthritis: Secondary | ICD-10-CM | POA: Diagnosis not present

## 2017-04-12 DIAGNOSIS — M150 Primary generalized (osteo)arthritis: Secondary | ICD-10-CM | POA: Diagnosis not present

## 2017-04-12 DIAGNOSIS — R700 Elevated erythrocyte sedimentation rate: Secondary | ICD-10-CM | POA: Diagnosis not present

## 2017-04-12 DIAGNOSIS — M2550 Pain in unspecified joint: Secondary | ICD-10-CM | POA: Diagnosis not present

## 2017-04-12 DIAGNOSIS — D649 Anemia, unspecified: Secondary | ICD-10-CM | POA: Diagnosis not present

## 2017-04-12 DIAGNOSIS — Z6828 Body mass index (BMI) 28.0-28.9, adult: Secondary | ICD-10-CM | POA: Diagnosis not present

## 2017-04-12 DIAGNOSIS — E663 Overweight: Secondary | ICD-10-CM | POA: Diagnosis not present

## 2017-04-12 DIAGNOSIS — M109 Gout, unspecified: Secondary | ICD-10-CM | POA: Diagnosis not present

## 2017-04-13 DIAGNOSIS — H10023 Other mucopurulent conjunctivitis, bilateral: Secondary | ICD-10-CM | POA: Diagnosis not present

## 2017-05-24 DIAGNOSIS — K582 Mixed irritable bowel syndrome: Secondary | ICD-10-CM | POA: Diagnosis not present

## 2017-06-16 DIAGNOSIS — R700 Elevated erythrocyte sedimentation rate: Secondary | ICD-10-CM | POA: Diagnosis not present

## 2017-06-16 DIAGNOSIS — D649 Anemia, unspecified: Secondary | ICD-10-CM | POA: Diagnosis not present

## 2017-06-16 DIAGNOSIS — M150 Primary generalized (osteo)arthritis: Secondary | ICD-10-CM | POA: Diagnosis not present

## 2017-06-16 DIAGNOSIS — Z6829 Body mass index (BMI) 29.0-29.9, adult: Secondary | ICD-10-CM | POA: Diagnosis not present

## 2017-06-16 DIAGNOSIS — M2550 Pain in unspecified joint: Secondary | ICD-10-CM | POA: Diagnosis not present

## 2017-06-16 DIAGNOSIS — E663 Overweight: Secondary | ICD-10-CM | POA: Diagnosis not present

## 2017-06-16 DIAGNOSIS — M109 Gout, unspecified: Secondary | ICD-10-CM | POA: Diagnosis not present

## 2017-06-16 DIAGNOSIS — M170 Bilateral primary osteoarthritis of knee: Secondary | ICD-10-CM | POA: Diagnosis not present

## 2017-06-26 DIAGNOSIS — Z8546 Personal history of malignant neoplasm of prostate: Secondary | ICD-10-CM | POA: Diagnosis not present

## 2017-06-26 DIAGNOSIS — E782 Mixed hyperlipidemia: Secondary | ICD-10-CM | POA: Diagnosis not present

## 2017-06-26 DIAGNOSIS — M109 Gout, unspecified: Secondary | ICD-10-CM | POA: Diagnosis not present

## 2017-06-26 DIAGNOSIS — I10 Essential (primary) hypertension: Secondary | ICD-10-CM | POA: Diagnosis not present

## 2017-06-26 DIAGNOSIS — Z0000 Encounter for general adult medical examination without abnormal findings: Secondary | ICD-10-CM | POA: Diagnosis not present

## 2017-07-04 DIAGNOSIS — H2513 Age-related nuclear cataract, bilateral: Secondary | ICD-10-CM | POA: Diagnosis not present

## 2017-08-08 DIAGNOSIS — L570 Actinic keratosis: Secondary | ICD-10-CM | POA: Diagnosis not present

## 2017-08-08 DIAGNOSIS — C4492 Squamous cell carcinoma of skin, unspecified: Secondary | ICD-10-CM

## 2017-08-08 DIAGNOSIS — D0462 Carcinoma in situ of skin of left upper limb, including shoulder: Secondary | ICD-10-CM | POA: Diagnosis not present

## 2017-08-08 HISTORY — DX: Squamous cell carcinoma of skin, unspecified: C44.92

## 2017-08-16 DIAGNOSIS — M2550 Pain in unspecified joint: Secondary | ICD-10-CM | POA: Diagnosis not present

## 2017-08-16 DIAGNOSIS — M150 Primary generalized (osteo)arthritis: Secondary | ICD-10-CM | POA: Diagnosis not present

## 2017-08-16 DIAGNOSIS — M170 Bilateral primary osteoarthritis of knee: Secondary | ICD-10-CM | POA: Diagnosis not present

## 2017-08-16 DIAGNOSIS — E663 Overweight: Secondary | ICD-10-CM | POA: Diagnosis not present

## 2017-08-16 DIAGNOSIS — M109 Gout, unspecified: Secondary | ICD-10-CM | POA: Diagnosis not present

## 2017-08-16 DIAGNOSIS — Z79899 Other long term (current) drug therapy: Secondary | ICD-10-CM | POA: Diagnosis not present

## 2017-08-16 DIAGNOSIS — Z6829 Body mass index (BMI) 29.0-29.9, adult: Secondary | ICD-10-CM | POA: Diagnosis not present

## 2017-08-23 DIAGNOSIS — M170 Bilateral primary osteoarthritis of knee: Secondary | ICD-10-CM | POA: Diagnosis not present

## 2017-08-29 DIAGNOSIS — M1711 Unilateral primary osteoarthritis, right knee: Secondary | ICD-10-CM | POA: Diagnosis not present

## 2017-08-29 DIAGNOSIS — M1712 Unilateral primary osteoarthritis, left knee: Secondary | ICD-10-CM | POA: Diagnosis not present

## 2017-09-14 DIAGNOSIS — E663 Overweight: Secondary | ICD-10-CM | POA: Diagnosis not present

## 2017-09-14 DIAGNOSIS — M2550 Pain in unspecified joint: Secondary | ICD-10-CM | POA: Diagnosis not present

## 2017-09-14 DIAGNOSIS — Z6829 Body mass index (BMI) 29.0-29.9, adult: Secondary | ICD-10-CM | POA: Diagnosis not present

## 2017-09-14 DIAGNOSIS — Z79899 Other long term (current) drug therapy: Secondary | ICD-10-CM | POA: Diagnosis not present

## 2017-09-14 DIAGNOSIS — M109 Gout, unspecified: Secondary | ICD-10-CM | POA: Diagnosis not present

## 2017-09-14 DIAGNOSIS — M150 Primary generalized (osteo)arthritis: Secondary | ICD-10-CM | POA: Diagnosis not present

## 2017-09-21 DIAGNOSIS — R300 Dysuria: Secondary | ICD-10-CM | POA: Diagnosis not present

## 2017-10-27 DIAGNOSIS — Z23 Encounter for immunization: Secondary | ICD-10-CM | POA: Diagnosis not present

## 2017-11-10 DIAGNOSIS — M1711 Unilateral primary osteoarthritis, right knee: Secondary | ICD-10-CM | POA: Diagnosis not present

## 2017-11-10 DIAGNOSIS — M25562 Pain in left knee: Secondary | ICD-10-CM | POA: Diagnosis not present

## 2017-11-10 DIAGNOSIS — M170 Bilateral primary osteoarthritis of knee: Secondary | ICD-10-CM | POA: Diagnosis not present

## 2017-11-10 DIAGNOSIS — M1712 Unilateral primary osteoarthritis, left knee: Secondary | ICD-10-CM | POA: Diagnosis not present

## 2017-12-05 DIAGNOSIS — L89312 Pressure ulcer of right buttock, stage 2: Secondary | ICD-10-CM | POA: Diagnosis not present

## 2017-12-18 DIAGNOSIS — E669 Obesity, unspecified: Secondary | ICD-10-CM | POA: Diagnosis not present

## 2017-12-18 DIAGNOSIS — M109 Gout, unspecified: Secondary | ICD-10-CM | POA: Diagnosis not present

## 2017-12-18 DIAGNOSIS — Z79899 Other long term (current) drug therapy: Secondary | ICD-10-CM | POA: Diagnosis not present

## 2017-12-18 DIAGNOSIS — M2550 Pain in unspecified joint: Secondary | ICD-10-CM | POA: Diagnosis not present

## 2017-12-18 DIAGNOSIS — M150 Primary generalized (osteo)arthritis: Secondary | ICD-10-CM | POA: Diagnosis not present

## 2017-12-18 DIAGNOSIS — Z6830 Body mass index (BMI) 30.0-30.9, adult: Secondary | ICD-10-CM | POA: Diagnosis not present

## 2018-01-19 DIAGNOSIS — M109 Gout, unspecified: Secondary | ICD-10-CM | POA: Diagnosis not present

## 2018-01-19 DIAGNOSIS — M2550 Pain in unspecified joint: Secondary | ICD-10-CM | POA: Diagnosis not present

## 2018-01-31 DIAGNOSIS — I4891 Unspecified atrial fibrillation: Secondary | ICD-10-CM | POA: Diagnosis not present

## 2018-01-31 DIAGNOSIS — I499 Cardiac arrhythmia, unspecified: Secondary | ICD-10-CM | POA: Diagnosis not present

## 2018-01-31 DIAGNOSIS — R5382 Chronic fatigue, unspecified: Secondary | ICD-10-CM | POA: Diagnosis not present

## 2018-01-31 DIAGNOSIS — E538 Deficiency of other specified B group vitamins: Secondary | ICD-10-CM | POA: Diagnosis not present

## 2018-01-31 DIAGNOSIS — R399 Unspecified symptoms and signs involving the genitourinary system: Secondary | ICD-10-CM | POA: Diagnosis not present

## 2018-02-01 ENCOUNTER — Encounter (HOSPITAL_COMMUNITY): Payer: Self-pay | Admitting: Nurse Practitioner

## 2018-02-01 ENCOUNTER — Ambulatory Visit (HOSPITAL_COMMUNITY)
Admission: RE | Admit: 2018-02-01 | Discharge: 2018-02-01 | Disposition: A | Discharge status: Home / Self Care | Payer: Medicare Other | Source: Ambulatory Visit | Attending: Nurse Practitioner | Admitting: Nurse Practitioner

## 2018-02-01 VITALS — BP 152/84 | HR 67 | Ht 70.0 in | Wt 206.2 lb

## 2018-02-01 DIAGNOSIS — Z96649 Presence of unspecified artificial hip joint: Secondary | ICD-10-CM | POA: Diagnosis not present

## 2018-02-01 DIAGNOSIS — I4510 Unspecified right bundle-branch block: Secondary | ICD-10-CM | POA: Diagnosis not present

## 2018-02-01 DIAGNOSIS — Z79899 Other long term (current) drug therapy: Secondary | ICD-10-CM | POA: Insufficient documentation

## 2018-02-01 DIAGNOSIS — R9431 Abnormal electrocardiogram [ECG] [EKG]: Secondary | ICD-10-CM | POA: Diagnosis not present

## 2018-02-01 DIAGNOSIS — Z7952 Long term (current) use of systemic steroids: Secondary | ICD-10-CM | POA: Diagnosis not present

## 2018-02-01 DIAGNOSIS — I4819 Other persistent atrial fibrillation: Secondary | ICD-10-CM

## 2018-02-01 DIAGNOSIS — I10 Essential (primary) hypertension: Secondary | ICD-10-CM | POA: Insufficient documentation

## 2018-02-01 DIAGNOSIS — I4891 Unspecified atrial fibrillation: Secondary | ICD-10-CM | POA: Insufficient documentation

## 2018-02-01 HISTORY — DX: Essential (primary) hypertension: I10

## 2018-02-01 MED ORDER — APIXABAN 5 MG PO TABS: 5.0000 mg | ORAL_TABLET | Freq: Two times a day (BID) | ORAL | 0 refills | Status: DC

## 2018-02-01 NOTE — Patient Instructions (Signed)
Stop aspirin  Start Eliquis 5 mg twice a day.

## 2018-02-01 NOTE — Progress Notes (Addendum)
Primary Care Physician: Maurice Small, MD Referring Physician: Dr. Wellington Hampshire Joseph Leach is a 85 y.o. male with a h/o rheumatoid arthritis, that is in the afib clinic for new onset afib found at [facility] office. He was being seen for not feeling well, lightheaded, off balance  for the last few days. He had been on prednisone daily for h/o gout for many months, recently stopped. His pain increased to the point that the prednisone was recently restarted 1/3. Ekg confirmed that he was in rate controlled afib. Pt admits that he has been more fatigued over the last 6 months, exact onset of afib unknown. He is rate controlled as he is already on carvedilol. He is here with the son and wife today.  Today, he denies symptoms of palpitations, chest pain, shortness of breath, orthopnea, PND, lower extremity edema,  presyncope, syncope, or neurologic sequela. + for fatigue, lightheadedness.The patient is tolerating medications without difficulties and is otherwise without complaint today.   Past Medical History:  Diagnosis Date  . Hypertension    Past Surgical History:  Procedure Laterality Date  . APPENDECTOMY    . LIGAMENT REPAIR Right   . PROSTATECTOMY    . TOTAL HIP ARTHROPLASTY      Current Outpatient Medications  Medication Sig Dispense Refill  . allopurinol (ZYLOPRIM) 100 MG tablet Take 100 mg by mouth 2 (two) times daily.    . carvedilol (COREG) 12.5 MG tablet Take 12.5 mg by mouth daily.    . colchicine 0.6 MG tablet Take 0.6 mg by mouth as needed. For gout flare    . Omega-3 Fatty Acids (FISH OIL) 1000 MG CAPS Take 1 capsule by mouth daily.    . predniSONE (DELTASONE) 10 MG tablet Take 10 mg by mouth daily with breakfast.    . apixaban (ELIQUIS) 5 MG TABS tablet Take 1 tablet (5 mg total) by mouth 2 (two) times daily. 60 tablet 0   No current facility-administered medications for this encounter.     No Known Allergies  Social History   Socioeconomic History  . Marital status: Married      Spouse name: Not on file  . Number of children: Not on file  . Years of education: Not on file  . Highest education level: Not on file  Occupational History  . Not on file  Social Needs  . Financial resource strain: Not on file  . Food insecurity:    Worry: Not on file    Inability: Not on file  . Transportation needs:    Medical: Not on file    Non-medical: Not on file  Tobacco Use  . Smoking status: Never Smoker  . Smokeless tobacco: Never Used  Substance and Sexual Activity  . Alcohol use: Not Currently    Comment: occasional  . Drug use: Never  . Sexual activity: Not on file  Lifestyle  . Physical activity:    Days per week: Not on file    Minutes per session: Not on file  . Stress: Not on file  Relationships  . Social connections:    Talks on phone: Not on file    Gets together: Not on file    Attends religious service: Not on file    Active member of club or organization: Not on file    Attends meetings of clubs or organizations: Not on file    Relationship status: Not on file  . Intimate partner violence:    Fear of current or ex partner:  Not on file    Emotionally abused: Not on file    Physically abused: Not on file    Forced sexual activity: Not on file  Other Topics Concern  . Not on file  Social History Narrative  . Not on file    No family history on file.  ROS- All systems are reviewed and negative except as per the HPI above  Physical Exam: Vitals:   02/01/18 1406  BP: (!) 152/84  Pulse: 67  Weight: 93.5 kg  Height: 5\' 10"  (1.778 m)   Wt Readings from Last 3 Encounters:  02/01/18 93.5 kg    Labs: No results found for: NA, K, CL, CO2, GLUCOSE, BUN, CREATININE, CALCIUM, PHOS, MG No results found for: INR No results found for: CHOL, HDL, LDLCALC, TRIG   GEN- The patient is well appearing, alert and oriented x 3 today.   Head- normocephalic, atraumatic Eyes-  Sclera clear, conjunctiva pink Ears- hearing intact Oropharynx-  clear Neck- supple, no JVP Lymph- no cervical lymphadenopathy Lungs- Clear to ausculation bilaterally, normal work of breathing Heart- irregular rate and rhythm, no murmurs, rubs or gallops, PMI not laterally displaced GI- soft, NT, ND, + BS Extremities- no clubbing, cyanosis, or edema MS- no significant deformity or atrophy Skin- no rash or lesion Psych- euthymic mood, full affect Neuro- strength and sensation are intact  EKG-afib with RBBB at 62 bpm PCP records reviewed Labs drawn yesterday have not been reviewed, but creatinine reported at 1.37, have requested for them to be forwarded once reviewed    Assessment and Plan: 1. Newly diagnosed afib General education re afib and triggers Rate controlled Pt tolerating fairly well but does report being fatigued for at least 6 months Continue carvedilol at 12.5 mg bid  Echo  Discussed that once anticoagulation in place x 3 weeks, we may discuss pursuing cardioversion to restore SR  2. CHA2DS2VASc score of 3(age x 2, htn) Denies a bleeding history No falls Risk vrs benefit of anticoagulation discussed to prevent a stroke associated with afib Will start 5 mg eliquis bid  Stop ASA Bleeding precautions discussed   F/u in 2 weeks  Labs reviewed from  PCP, 1/20- Labs reviewed from PCP-Rbc, 4.0, HGB- 12.4, HCT- 37.8 , plts 177, Cmet with 1.37, BUN 34, K+ at 4.2, liver enzymes wnl    Butch Penny C. Carroll, [facility] [address] [place], [place] [address] [phone]

## 2018-02-02 ENCOUNTER — Telehealth: Payer: Self-pay | Admitting: Cardiology

## 2018-02-02 NOTE — Telephone Encounter (Signed)
Patients son [name]) called and is not sure why his father Joseph Leach needs to see another doctor Marlou Porch).  Will call PCP office on Monday to find out why and then call patient back.

## 2018-02-05 NOTE — Addendum Note (Signed)
Encounter addended by: Sherran Needs, NP on: 02/05/2018 12:35 PM  Actions taken: Clinical Note Signed

## 2018-02-09 ENCOUNTER — Ambulatory Visit (HOSPITAL_COMMUNITY)
Admission: RE | Admit: 2018-02-09 | Discharge: 2018-02-09 | Disposition: A | Discharge status: Home / Self Care | Payer: Medicare Other | Source: Ambulatory Visit | Attending: Nurse Practitioner | Admitting: Nurse Practitioner

## 2018-02-09 DIAGNOSIS — I4819 Other persistent atrial fibrillation: Secondary | ICD-10-CM | POA: Diagnosis not present

## 2018-02-09 DIAGNOSIS — I10 Essential (primary) hypertension: Secondary | ICD-10-CM | POA: Insufficient documentation

## 2018-02-09 DIAGNOSIS — R0600 Dyspnea, unspecified: Secondary | ICD-10-CM | POA: Insufficient documentation

## 2018-02-09 DIAGNOSIS — I313 Pericardial effusion (noninflammatory): Secondary | ICD-10-CM | POA: Diagnosis not present

## 2018-02-09 DIAGNOSIS — I071 Rheumatic tricuspid insufficiency: Secondary | ICD-10-CM | POA: Insufficient documentation

## 2018-02-09 DIAGNOSIS — I371 Nonrheumatic pulmonary valve insufficiency: Secondary | ICD-10-CM | POA: Diagnosis not present

## 2018-02-09 NOTE — Progress Notes (Signed)
  Echocardiogram 2D Echocardiogram has been performed.  Joseph Leach R 02/09/2018, 10:14 AM

## 2018-02-13 ENCOUNTER — Telehealth (HOSPITAL_COMMUNITY): Payer: Self-pay | Admitting: *Deleted

## 2018-02-13 NOTE — Telephone Encounter (Signed)
Patients son called in stating patient noticed a reddened area on back of thigh about 6 inches long yesterday - does not remember hitting leg but does endorse a new chair that is much harder to sit in and wonders if that could have contributed. Pt denies pain or swelling at the area. Just a new finding - pt did recently start Eliquis. Son will watch the area if grows or becomes painful will call back in the AM for assessment.

## 2018-02-19 ENCOUNTER — Ambulatory Visit (HOSPITAL_COMMUNITY)
Admission: RE | Admit: 2018-02-19 | Discharge: 2018-02-19 | Disposition: A | Discharge status: Home / Self Care | Payer: Medicare Other | Source: Ambulatory Visit | Attending: Nurse Practitioner | Admitting: Nurse Practitioner

## 2018-02-19 ENCOUNTER — Encounter (HOSPITAL_COMMUNITY): Payer: Self-pay | Admitting: Nurse Practitioner

## 2018-02-19 VITALS — BP 206/84 | HR 74 | Ht 70.0 in | Wt 204.0 lb

## 2018-02-19 DIAGNOSIS — M109 Gout, unspecified: Secondary | ICD-10-CM | POA: Diagnosis not present

## 2018-02-19 DIAGNOSIS — Z6830 Body mass index (BMI) 30.0-30.9, adult: Secondary | ICD-10-CM | POA: Diagnosis not present

## 2018-02-19 DIAGNOSIS — M2550 Pain in unspecified joint: Secondary | ICD-10-CM | POA: Diagnosis not present

## 2018-02-19 DIAGNOSIS — I10 Essential (primary) hypertension: Secondary | ICD-10-CM | POA: Insufficient documentation

## 2018-02-19 DIAGNOSIS — M1990 Unspecified osteoarthritis, unspecified site: Secondary | ICD-10-CM | POA: Diagnosis not present

## 2018-02-19 DIAGNOSIS — Z79899 Other long term (current) drug therapy: Secondary | ICD-10-CM | POA: Diagnosis not present

## 2018-02-19 DIAGNOSIS — Z7901 Long term (current) use of anticoagulants: Secondary | ICD-10-CM | POA: Insufficient documentation

## 2018-02-19 DIAGNOSIS — I313 Pericardial effusion (noninflammatory): Secondary | ICD-10-CM | POA: Insufficient documentation

## 2018-02-19 DIAGNOSIS — Z96649 Presence of unspecified artificial hip joint: Secondary | ICD-10-CM | POA: Diagnosis not present

## 2018-02-19 DIAGNOSIS — R9431 Abnormal electrocardiogram [ECG] [EKG]: Secondary | ICD-10-CM | POA: Diagnosis not present

## 2018-02-19 DIAGNOSIS — Z7952 Long term (current) use of systemic steroids: Secondary | ICD-10-CM | POA: Insufficient documentation

## 2018-02-19 DIAGNOSIS — I4819 Other persistent atrial fibrillation: Secondary | ICD-10-CM

## 2018-02-19 DIAGNOSIS — E669 Obesity, unspecified: Secondary | ICD-10-CM | POA: Diagnosis not present

## 2018-02-19 DIAGNOSIS — M069 Rheumatoid arthritis, unspecified: Secondary | ICD-10-CM | POA: Insufficient documentation

## 2018-02-19 DIAGNOSIS — I4891 Unspecified atrial fibrillation: Secondary | ICD-10-CM | POA: Insufficient documentation

## 2018-02-19 DIAGNOSIS — I4510 Unspecified right bundle-branch block: Secondary | ICD-10-CM | POA: Insufficient documentation

## 2018-02-19 DIAGNOSIS — M150 Primary generalized (osteo)arthritis: Secondary | ICD-10-CM | POA: Diagnosis not present

## 2018-02-19 MED ORDER — CARVEDILOL 12.5 MG PO TABS: ORAL_TABLET | ORAL | Status: DC

## 2018-02-19 NOTE — Progress Notes (Signed)
Primary Care Physician: Maurice Small, MD Referring Physician: Dr. Wellington Hampshire Mcmanaman is a 85 y.o. male with a h/o rheumatoid arthritis, that is in the afib clinic for new onset afib found at [facility] office. He was being seen for not feeling well, lightheaded, off balance  for the last few days. He had been on prednisone daily for h/o gout for many months, recently stopped. His pain increased to the point that the prednisone was recently restarted 1/3. Ekg confirmed that he was in rate controlled afib. Pt admits that he has been more fatigued over the last 6 months, exact onset of afib unknown. He is rate controlled as he is already on carvedilol. He is here with the son and wife today.  F/u in afib clinic 2/3. His wife noted a bruise left posterior thigh just above the knee area last week,now appears to be resolving and has migrated to the upper posterior calf area.He has switched to sitting in a different chair   His echo reviewed and he does have severly dilated left atrium and will make trying to restore and maintain SR very difficult. His BP is also very high on presentation today. His son states that his BP has been elevated for several weeks, on recheck 180/80.  Today, he denies symptoms of palpitations, chest pain, shortness of breath, orthopnea, PND, lower extremity edema,  presyncope, syncope, or neurologic sequela. + for fatigue, lightheadedness.The patient is tolerating medications without difficulties and is otherwise without complaint today.   Past Medical History:  Diagnosis Date  . Hypertension    Past Surgical History:  Procedure Laterality Date  . APPENDECTOMY    . LIGAMENT REPAIR Right   . PROSTATECTOMY    . TOTAL HIP ARTHROPLASTY      Current Outpatient Medications  Medication Sig Dispense Refill  . allopurinol (ZYLOPRIM) 100 MG tablet Take 100 mg by mouth 2 (two) times daily.    Marland Kitchen apixaban (ELIQUIS) 5 MG TABS tablet Take 1 tablet (5 mg total) by mouth 2 (two) times  daily. 60 tablet 0  . carvedilol (COREG) 12.5 MG tablet Take 1 tablet in the AM and 1/2 tablet in the PM    . Omega-3 Fatty Acids (FISH OIL) 1000 MG CAPS Take 1 capsule by mouth daily.    . predniSONE (DELTASONE) 10 MG tablet Take 10 mg by mouth daily with breakfast.    . colchicine 0.6 MG tablet Take 0.6 mg by mouth as needed. For gout flare     No current facility-administered medications for this encounter.     No Known Allergies  Social History   Socioeconomic History  . Marital status: Married    Spouse name: Not on file  . Number of children: Not on file  . Years of education: Not on file  . Highest education level: Not on file  Occupational History  . Not on file  Social Needs  . Financial resource strain: Not on file  . Food insecurity:    Worry: Not on file    Inability: Not on file  . Transportation needs:    Medical: Not on file    Non-medical: Not on file  Tobacco Use  . Smoking status: Never Smoker  . Smokeless tobacco: Never Used  Substance and Sexual Activity  . Alcohol use: Not Currently    Comment: occasional  . Drug use: Never  . Sexual activity: Not on file  Lifestyle  . Physical activity:    Days per week: Not  on file    Minutes per session: Not on file  . Stress: Not on file  Relationships  . Social connections:    Talks on phone: Not on file    Gets together: Not on file    Attends religious service: Not on file    Active member of club or organization: Not on file    Attends meetings of clubs or organizations: Not on file    Relationship status: Not on file  . Intimate partner violence:    Fear of current or ex partner: Not on file    Emotionally abused: Not on file    Physically abused: Not on file    Forced sexual activity: Not on file  Other Topics Concern  . Not on file  Social History Narrative  . Not on file    No family history on file.  ROS- All systems are reviewed and negative except as per the HPI above  Physical  Exam: Vitals:   02/19/18 1001  BP: (!) 206/84  Pulse: 74  SpO2: 96%  Weight: 92.5 kg  Height: 5\' 10"  (1.778 m)   Wt Readings from Last 3 Encounters:  02/19/18 92.5 kg  02/01/18 93.5 kg    Labs: No results found for: NA, K, CL, CO2, GLUCOSE, BUN, CREATININE, CALCIUM, PHOS, MG No results found for: INR No results found for: CHOL, HDL, LDLCALC, TRIG   GEN- The patient is well appearing, alert and oriented x 3 today.   Head- normocephalic, atraumatic Eyes-  Sclera clear, conjunctiva pink Ears- hearing intact Oropharynx- clear Neck- supple, no JVP Lymph- no cervical lymphadenopathy Lungs- Clear to ausculation bilaterally, normal work of breathing Heart- irregular rate and rhythm, no murmurs, rubs or gallops, PMI not laterally displaced GI- soft, NT, ND, + BS Extremities- no clubbing, cyanosis, or edema MS- no significant deformity or atrophy Skin- no rash or lesion Psych- euthymic mood, full affect Neuro- strength and sensation are intact  EKG-afib with RBBB at 62 bpm PCP records reviewed Labs drawn yesterday have not been reviewed, but creatinine reported at 1.37, have requested for them to be forwarded once reviewed Echo-Study Conclusions  - Left ventricle: The cavity size was normal. Wall thickness was   increased in a pattern of moderate LVH. Systolic function was   normal. The estimated ejection fraction was in the range of 55%   to 60%. Doppler parameters are consistent with abnormal left   ventricular relaxation (grade 1 diastolic dysfunction). - Aortic valve: Sclerosis without stenosis. Valve area (VTI): 1.5   cm^2. Valve area (Vmax): 1.74 cm^2. Valve area (Vmean): 1.71   cm^2. - Mitral valve: Calcified annulus. - Left atrium: The atrium was severely dilated. - Atrial septum: No defect or patent foramen ovale was identified. - Pericardium, extracardiac: Small pericardial effusion mostly over   the RV no tamponade.   Assessment and Plan: 1. Newly diagnosed  afib General education re afib and triggers Rate controlled Pt tolerating fairly well  Echo reviewed  We previously discussed that once anticoagulation in place x 3 weeks, we still could try  pursuing cardioversion to restore SR, but based on the size of the left atrium it will be very difficult to maintain SR, therefore rate control may be his best option.  2. CHA2DS2VASc score of 3(age x 2, htn) Denies a bleeding history No falls Risk vrs benefit of anticoagulation discussed to prevent a stroke associated with afib Cotinine 5 mg eliquis bid, je is appropriately dosed He has a resolving bruise  back of his left thigh may have been 2/2 to changing to a different  chair that has a harder edge ASA stopped and asked pt to stop fish oil and it can also thin the blood, prednisone may also be contributing Bleeding precautions discussed   3. HTN Will increase carvedilol to 6.25 mg in the pm, continue carvedilol at 12.5 mg pm Will have to watch HR to prevent brady with this  Blood pressure/HR check here in one week   Butch Penny C. Isaiha Asare, [facility] [address] [place], [place] [address] [phone]

## 2018-02-19 NOTE — Patient Instructions (Signed)
Increase coreg to 1 tablet in the AM and 1/2 tablet in the PM

## 2018-02-26 ENCOUNTER — Other Ambulatory Visit (HOSPITAL_COMMUNITY): Payer: Self-pay | Admitting: Nurse Practitioner

## 2018-02-27 ENCOUNTER — Encounter (HOSPITAL_COMMUNITY): Payer: Self-pay | Admitting: Nurse Practitioner

## 2018-02-27 ENCOUNTER — Ambulatory Visit (HOSPITAL_COMMUNITY)
Admission: RE | Admit: 2018-02-27 | Discharge: 2018-02-27 | Disposition: A | Discharge status: Home / Self Care | Payer: Medicare Other | Source: Ambulatory Visit | Attending: Nurse Practitioner | Admitting: Nurse Practitioner

## 2018-02-27 DIAGNOSIS — I10 Essential (primary) hypertension: Secondary | ICD-10-CM | POA: Insufficient documentation

## 2018-02-27 DIAGNOSIS — Z96642 Presence of left artificial hip joint: Secondary | ICD-10-CM | POA: Diagnosis not present

## 2018-02-27 DIAGNOSIS — Z7901 Long term (current) use of anticoagulants: Secondary | ICD-10-CM | POA: Insufficient documentation

## 2018-02-27 DIAGNOSIS — Z79899 Other long term (current) drug therapy: Secondary | ICD-10-CM | POA: Insufficient documentation

## 2018-02-27 DIAGNOSIS — I4891 Unspecified atrial fibrillation: Secondary | ICD-10-CM | POA: Diagnosis not present

## 2018-02-27 DIAGNOSIS — Z7952 Long term (current) use of systemic steroids: Secondary | ICD-10-CM | POA: Insufficient documentation

## 2018-02-27 DIAGNOSIS — M109 Gout, unspecified: Secondary | ICD-10-CM | POA: Insufficient documentation

## 2018-02-27 MED ORDER — CHLORTHALIDONE 25 MG PO TABS: 12.5000 mg | ORAL_TABLET | Freq: Every day | ORAL | 3 refills | Status: DC

## 2018-02-27 NOTE — Patient Instructions (Signed)
Start 1/2 tablet once a day of chlothalidone

## 2018-02-27 NOTE — Progress Notes (Addendum)
Pt in for BP check today.  To be reviewed by Roderic Palau, NP  Pt in for BP/HR check. On presentation, he had a BP of 202/98 HR in the 70's. After a few minutes of rest, 174/78. At home he is getting 102-585- systolic BP readings. I have a PharmD resident with me today and after discussing the best antihypertensive, it was decided to start pt on 1/2 tab of 25 mg of Chlorthalidone in addition to his current BB dose.Marland Kitchen He will be seen back in 2 weeks with HR/BP check, bmet. He will continue to check BP's at home and let me know if he does not tolerate.

## 2018-03-07 DIAGNOSIS — M25561 Pain in right knee: Secondary | ICD-10-CM | POA: Diagnosis not present

## 2018-03-07 DIAGNOSIS — M170 Bilateral primary osteoarthritis of knee: Secondary | ICD-10-CM | POA: Diagnosis not present

## 2018-03-07 DIAGNOSIS — M25562 Pain in left knee: Secondary | ICD-10-CM | POA: Diagnosis not present

## 2018-03-13 ENCOUNTER — Ambulatory Visit (HOSPITAL_COMMUNITY)
Admission: RE | Admit: 2018-03-13 | Discharge: 2018-03-13 | Disposition: A | Discharge status: Home / Self Care | Payer: Medicare Other | Source: Ambulatory Visit | Attending: Nurse Practitioner | Admitting: Nurse Practitioner

## 2018-03-13 DIAGNOSIS — I4819 Other persistent atrial fibrillation: Secondary | ICD-10-CM | POA: Insufficient documentation

## 2018-03-13 LAB — BASIC METABOLIC PANEL
Anion gap: 8 (ref 5–15)
BUN: 38 mg/dL — ABNORMAL HIGH (ref 8–23)
CO2: 24 mmol/L (ref 22–32)
Calcium: 9.8 mg/dL (ref 8.9–10.3)
Chloride: 109 mmol/L (ref 98–111)
Creatinine, Ser: 1.64 mg/dL — ABNORMAL HIGH (ref 0.61–1.24)
GFR calc Af Amer: 42 mL/min — ABNORMAL LOW (ref >60)
GFR calc non Af Amer: 36 mL/min — ABNORMAL LOW (ref >60)
Glucose, Bld: 88 mg/dL (ref 70–99)
Potassium: 3.7 mmol/L (ref 3.5–5.1)
Sodium: 141 mmol/L (ref 135–145)

## 2018-03-13 NOTE — Progress Notes (Signed)
Pt in for BP check after starting chlorthalidone 25 mg 1/2 tab a day for BP management. BP 142/ 64. Much improved over 483 systolic last week. He is not having any sie effects from drug. Bmet drawn. Will bring back in 2-3 weeks for full visit.

## 2018-03-14 DIAGNOSIS — M25562 Pain in left knee: Secondary | ICD-10-CM | POA: Diagnosis not present

## 2018-03-14 DIAGNOSIS — M25561 Pain in right knee: Secondary | ICD-10-CM | POA: Diagnosis not present

## 2018-03-14 DIAGNOSIS — M170 Bilateral primary osteoarthritis of knee: Secondary | ICD-10-CM | POA: Diagnosis not present

## 2018-03-21 DIAGNOSIS — M170 Bilateral primary osteoarthritis of knee: Secondary | ICD-10-CM | POA: Diagnosis not present

## 2018-03-22 ENCOUNTER — Ambulatory Visit (HOSPITAL_COMMUNITY)
Admission: RE | Admit: 2018-03-22 | Discharge: 2018-03-22 | Disposition: A | Discharge status: Home / Self Care | Payer: Medicare Other | Source: Ambulatory Visit | Attending: Nurse Practitioner | Admitting: Nurse Practitioner

## 2018-03-22 ENCOUNTER — Encounter (HOSPITAL_COMMUNITY): Payer: Self-pay | Admitting: Nurse Practitioner

## 2018-03-22 VITALS — BP 150/90

## 2018-03-22 DIAGNOSIS — I480 Paroxysmal atrial fibrillation: Secondary | ICD-10-CM | POA: Diagnosis not present

## 2018-03-22 DIAGNOSIS — R5383 Other fatigue: Secondary | ICD-10-CM | POA: Diagnosis not present

## 2018-03-22 DIAGNOSIS — R52 Pain, unspecified: Secondary | ICD-10-CM | POA: Insufficient documentation

## 2018-03-22 DIAGNOSIS — Z79899 Other long term (current) drug therapy: Secondary | ICD-10-CM | POA: Insufficient documentation

## 2018-03-22 DIAGNOSIS — M069 Rheumatoid arthritis, unspecified: Secondary | ICD-10-CM | POA: Insufficient documentation

## 2018-03-22 DIAGNOSIS — I4819 Other persistent atrial fibrillation: Secondary | ICD-10-CM | POA: Diagnosis not present

## 2018-03-22 DIAGNOSIS — I10 Essential (primary) hypertension: Secondary | ICD-10-CM | POA: Insufficient documentation

## 2018-03-22 LAB — CBC WITH DIFFERENTIAL/PLATELET
Abs Immature Granulocytes: 0.33 K/uL — ABNORMAL HIGH (ref 0.00–0.07)
Basophils Absolute: 0 K/uL (ref 0.0–0.1)
Basophils Relative: 0 %
Eosinophils Absolute: 0.1 K/uL (ref 0.0–0.5)
Eosinophils Relative: 1 %
HCT: 40 % (ref 39.0–52.0)
Hemoglobin: 12.3 g/dL — ABNORMAL LOW (ref 13.0–17.0)
Immature Granulocytes: 5 %
Lymphocytes Relative: 24 %
Lymphs Abs: 1.7 K/uL (ref 0.7–4.0)
MCH: 30.7 pg (ref 26.0–34.0)
MCHC: 30.8 g/dL (ref 30.0–36.0)
MCV: 99.8 fL (ref 80.0–100.0)
Monocytes Absolute: 0.3 K/uL (ref 0.1–1.0)
Monocytes Relative: 4 %
Neutro Abs: 4.7 K/uL (ref 1.7–7.7)
Neutrophils Relative %: 66 %
Platelets: 170 K/uL (ref 150–400)
RBC: 4.01 MIL/uL — ABNORMAL LOW (ref 4.22–5.81)
RDW: 15.8 % — ABNORMAL HIGH (ref 11.5–15.5)
WBC: 7.2 K/uL (ref 4.0–10.5)
nRBC: 0 % (ref 0.0–0.2)

## 2018-03-22 LAB — BASIC METABOLIC PANEL WITH GFR
Anion gap: 8 (ref 5–15)
BUN: 40 mg/dL — ABNORMAL HIGH (ref 8–23)
CO2: 23 mmol/L (ref 22–32)
Calcium: 9.5 mg/dL (ref 8.9–10.3)
Chloride: 110 mmol/L (ref 98–111)
Creatinine, Ser: 1.6 mg/dL — ABNORMAL HIGH (ref 0.61–1.24)
GFR calc Af Amer: 43 mL/min — ABNORMAL LOW
GFR calc non Af Amer: 37 mL/min — ABNORMAL LOW
Glucose, Bld: 144 mg/dL — ABNORMAL HIGH (ref 70–99)
Potassium: 3.6 mmol/L (ref 3.5–5.1)
Sodium: 141 mmol/L (ref 135–145)

## 2018-03-22 NOTE — Progress Notes (Signed)
Primary Care Physician: Joseph Small, MD Referring Physician: Dr. [place] Hampshire [name] is a 85 y.o. male with a h/o rheumatoid arthritis, that is in the afib clinic for new onset afib found at [facility] office. He was being seen for not feeling well, lightheaded, off balance  for the last few days. He had been on prednisone daily for h/o gout for many months, recently stopped. His pain increased to the point that the prednisone was recently restarted 1/3. Ekg confirmed that he was in rate controlled afib. Pt admits that he has been more fatigued over the last 6 months, exact onset of afib unknown. He is rate controlled as he is already on carvedilol. He is here with the son and wife today.  F/u in afib clinic 2/3. His wife noted a bruise left posterior thigh just above the knee area last week,now appears to be resolving and has migrated to the upper posterior calf area.He has switched to sitting in a different chair   His echo reviewed and he does have severly dilated left atrium and will make trying to restore and maintain SR very difficult. His BP is also very high on presentation today, aorund 161 systolic.  His son states that his BP has been elevated for several weeks, on recheck 180/80. He was started on 12.5 mg chlothilidone daily. He was then decreased to 12.5 mg to qd  F/u afib clinic,3/5. He is here for f/u bp check and repeat bmet and cbc, now on 12.5 mg chlorthalidone qod and blood thinner x one month. His BP is now running around 096- 045 systolic at home. Repeat bmet shows creatinine at 1.6, 1.64 on last lab, 1.46 on last bmet with PCP. CBC show hgb at 12.3 but do not have a baseline to compare. He is feeling well. No complaints. No further bleeding issues. His eliquis 5 mg bid will need to be reduced to 2.5 mg bid for his creat now above 1.5.  Today, he denies symptoms of palpitations, chest pain, shortness of breath, orthopnea, PND, lower extremity edema,  presyncope, syncope, or neurologic  sequela. + for fatigue, lightheadedness.The patient is tolerating medications without difficulties and is otherwise without complaint today.   Past Medical History:  Diagnosis Date  . Hypertension    Past Surgical History:  Procedure Laterality Date  . APPENDECTOMY    . LIGAMENT REPAIR Right   . PROSTATECTOMY    . TOTAL HIP ARTHROPLASTY      Current Outpatient Medications  Medication Sig Dispense Refill  . allopurinol (ZYLOPRIM) 100 MG tablet Take 100 mg by mouth 2 (two) times daily.    . carvedilol (COREG) 12.5 MG tablet Take 1 tablet in the AM and 1/2 tablet in the PM (Patient not taking: Reported on 03/22/2018)    . chlorthalidone (HYGROTON) 25 MG tablet Take 0.5 tablets (12.5 mg total) by mouth daily. (Patient not taking: Reported on 03/22/2018) 30 tablet 3  . colchicine 0.6 MG tablet Take 0.6 mg by mouth as needed. For gout flare    . ELIQUIS 5 MG TABS tablet TAKE 1 TABLET BY MOUTH TWICE DAILY (Patient not taking: Reported on 03/22/2018) 60 tablet 6  . predniSONE (DELTASONE) 10 MG tablet Take 7.5 mg by mouth daily with breakfast.      No current facility-administered medications for this encounter.     No Known Allergies  Social History   Socioeconomic History  . Marital status: Married    Spouse name: Not on file  .  Number of children: Not on file  . Years of education: Not on file  . Highest education level: Not on file  Occupational History  . Not on file  Social Needs  . Financial resource strain: Not on file  . Food insecurity:    Worry: Not on file    Inability: Not on file  . Transportation needs:    Medical: Not on file    Non-medical: Not on file  Tobacco Use  . Smoking status: Never Smoker  . Smokeless tobacco: Never Used  Substance and Sexual Activity  . Alcohol use: Not Currently    Comment: occasional  . Drug use: Never  . Sexual activity: Not on file  Lifestyle  . Physical activity:    Days per week: Not on file    Minutes per session: Not on file   . Stress: Not on file  Relationships  . Social connections:    Talks on phone: Not on file    Gets together: Not on file    Attends religious service: Not on file    Active member of club or organization: Not on file    Attends meetings of clubs or organizations: Not on file    Relationship status: Not on file  . Intimate partner violence:    Fear of current or ex partner: Not on file    Emotionally abused: Not on file    Physically abused: Not on file    Forced sexual activity: Not on file  Other Topics Concern  . Not on file  Social History Narrative  . Not on file    No family history on file.  ROS- All systems are reviewed and negative except as per the HPI above  Physical Exam: Vitals:   03/22/18 0827  BP: (!) 150/90   Wt Readings from Last 3 Encounters:  02/27/18 92.1 kg  02/19/18 92.5 kg  02/01/18 93.5 kg    Labs: Lab Results  Component Value Date   NA 141 03/22/2018   K 3.6 03/22/2018   CL 110 03/22/2018   CO2 23 03/22/2018   GLUCOSE 144 (H) 03/22/2018   BUN 40 (H) 03/22/2018   CREATININE 1.60 (H) 03/22/2018   CALCIUM 9.5 03/22/2018   No results found for: INR No results found for: CHOL, HDL, LDLCALC, TRIG   GEN- The patient is well appearing, alert and oriented x 3 today.   Head- normocephalic, atraumatic Eyes-  Sclera clear, conjunctiva pink Ears- hearing intact Oropharynx- clear Neck- supple, no JVP Lymph- no cervical lymphadenopathy Lungs- Clear to ausculation bilaterally, normal work of breathing Heart- irregular rate and rhythm, no murmurs, rubs or gallops, PMI not laterally displaced GI- soft, NT, ND, + BS Extremities- no clubbing, cyanosis, or edema MS- no significant deformity or atrophy Skin- no rash or lesion Psych- euthymic mood, full affect Neuro- strength and sensation are intact  EKG-not done today PCP records reviewed Labs drawn yesterday have not been reviewed, but creatinine reported at 1.37, have requested for them to  be forwarded once reviewed Echo-Study Conclusions  - Left ventricle: The cavity size was normal. Wall thickness was   increased in a pattern of moderate LVH. Systolic function was   normal. The estimated ejection fraction was in the range of 55%   to 60%. Doppler parameters are consistent with abnormal left   ventricular relaxation (grade 1 diastolic dysfunction). - Aortic valve: Sclerosis without stenosis. Valve area (VTI): 1.5   cm^2. Valve area (Vmax): 1.74 cm^2. Valve area (Vmean):  1.71   cm^2. - Mitral valve: Calcified annulus. - Left atrium: The atrium was severely dilated. - Atrial septum: No defect or patent foramen ovale was identified. - Pericardium, extracardiac: Leach pericardial effusion mostly over   the RV no tamponade.   Assessment and Plan: 1. Newly diagnosed afib Rate controlled Pt tolerating  well  We previously discussed that once anticoagulation in place x 3 weeks, we still could try  pursuing cardioversion to restore SR, but based on the size of the left atrium it will be very difficult to maintain SR, therefore rate control may be his best option.  2. CHA2DS2VASc score of 3(age x 2, htn) Bmet today shows  creatinine x the last 2 labs at 1.6, will need to lower eliquis to 2.5 mg bid  3. HTN Continue carvedilol to 6.25 mg in the pm, 12.5 mg am Much better controlled with addition of chlorthalidone 12.5 mg qod Continue BP checks at home  Will  need f/u with PCP in about 4 weeks   Butch Penny C. , [facility] [address] [place], [place] [address] [phone]

## 2018-04-02 ENCOUNTER — Ambulatory Visit (HOSPITAL_COMMUNITY): Payer: Medicare Other | Admitting: Nurse Practitioner

## 2018-06-14 DIAGNOSIS — M1712 Unilateral primary osteoarthritis, left knee: Secondary | ICD-10-CM | POA: Diagnosis not present

## 2018-06-19 DIAGNOSIS — R197 Diarrhea, unspecified: Secondary | ICD-10-CM | POA: Diagnosis not present

## 2018-06-26 DIAGNOSIS — Z6830 Body mass index (BMI) 30.0-30.9, adult: Secondary | ICD-10-CM | POA: Diagnosis not present

## 2018-06-26 DIAGNOSIS — M150 Primary generalized (osteo)arthritis: Secondary | ICD-10-CM | POA: Diagnosis not present

## 2018-06-26 DIAGNOSIS — M109 Gout, unspecified: Secondary | ICD-10-CM | POA: Diagnosis not present

## 2018-06-26 DIAGNOSIS — M1990 Unspecified osteoarthritis, unspecified site: Secondary | ICD-10-CM | POA: Diagnosis not present

## 2018-06-26 DIAGNOSIS — E669 Obesity, unspecified: Secondary | ICD-10-CM | POA: Diagnosis not present

## 2018-06-26 DIAGNOSIS — N183 Chronic kidney disease, stage 3 (moderate): Secondary | ICD-10-CM | POA: Diagnosis not present

## 2018-06-26 DIAGNOSIS — Z79899 Other long term (current) drug therapy: Secondary | ICD-10-CM | POA: Diagnosis not present

## 2018-06-26 DIAGNOSIS — M0609 Rheumatoid arthritis without rheumatoid factor, multiple sites: Secondary | ICD-10-CM | POA: Diagnosis not present

## 2018-07-11 DIAGNOSIS — Z79899 Other long term (current) drug therapy: Secondary | ICD-10-CM | POA: Diagnosis not present

## 2018-07-25 DIAGNOSIS — I10 Essential (primary) hypertension: Secondary | ICD-10-CM | POA: Diagnosis not present

## 2018-07-25 DIAGNOSIS — E782 Mixed hyperlipidemia: Secondary | ICD-10-CM | POA: Diagnosis not present

## 2018-07-25 DIAGNOSIS — R739 Hyperglycemia, unspecified: Secondary | ICD-10-CM | POA: Diagnosis not present

## 2018-07-27 DIAGNOSIS — Z0000 Encounter for general adult medical examination without abnormal findings: Secondary | ICD-10-CM | POA: Diagnosis not present

## 2018-08-31 ENCOUNTER — Other Ambulatory Visit (HOSPITAL_COMMUNITY): Payer: Self-pay | Admitting: Nurse Practitioner

## 2018-09-18 DIAGNOSIS — Z23 Encounter for immunization: Secondary | ICD-10-CM | POA: Diagnosis not present

## 2018-09-28 DIAGNOSIS — M25562 Pain in left knee: Secondary | ICD-10-CM | POA: Diagnosis not present

## 2018-10-26 DIAGNOSIS — M0609 Rheumatoid arthritis without rheumatoid factor, multiple sites: Secondary | ICD-10-CM | POA: Diagnosis not present

## 2018-10-26 DIAGNOSIS — M109 Gout, unspecified: Secondary | ICD-10-CM | POA: Diagnosis not present

## 2018-10-26 DIAGNOSIS — Z79899 Other long term (current) drug therapy: Secondary | ICD-10-CM | POA: Diagnosis not present

## 2018-10-26 DIAGNOSIS — M150 Primary generalized (osteo)arthritis: Secondary | ICD-10-CM | POA: Diagnosis not present

## 2018-10-26 DIAGNOSIS — M8580 Other specified disorders of bone density and structure, unspecified site: Secondary | ICD-10-CM | POA: Diagnosis not present

## 2018-10-26 DIAGNOSIS — E663 Overweight: Secondary | ICD-10-CM | POA: Diagnosis not present

## 2018-10-26 DIAGNOSIS — Z6829 Body mass index (BMI) 29.0-29.9, adult: Secondary | ICD-10-CM | POA: Diagnosis not present

## 2018-10-26 DIAGNOSIS — M1990 Unspecified osteoarthritis, unspecified site: Secondary | ICD-10-CM | POA: Diagnosis not present

## 2018-10-30 ENCOUNTER — Other Ambulatory Visit: Payer: Self-pay | Admitting: Physician Assistant

## 2018-10-30 DIAGNOSIS — R5381 Other malaise: Secondary | ICD-10-CM

## 2018-11-02 ENCOUNTER — Other Ambulatory Visit: Payer: Self-pay | Admitting: Physician Assistant

## 2018-11-02 DIAGNOSIS — Z7952 Long term (current) use of systemic steroids: Secondary | ICD-10-CM

## 2018-11-16 DIAGNOSIS — Z23 Encounter for immunization: Secondary | ICD-10-CM | POA: Diagnosis not present

## 2018-11-16 DIAGNOSIS — S5012XA Contusion of left forearm, initial encounter: Secondary | ICD-10-CM | POA: Diagnosis not present

## 2018-11-16 DIAGNOSIS — T148XXA Other injury of unspecified body region, initial encounter: Secondary | ICD-10-CM | POA: Diagnosis not present

## 2018-11-29 ENCOUNTER — Other Ambulatory Visit: Payer: Self-pay | Admitting: *Deleted

## 2018-11-29 MED ORDER — APIXABAN 5 MG PO TABS: 5.0000 mg | ORAL_TABLET | Freq: Two times a day (BID) | ORAL | 5 refills | Status: DC

## 2018-11-29 NOTE — Telephone Encounter (Signed)
Prescription refill request for Eliquis received.  Last office visit: 03-22-2018, Roderic Palau Scr: 1.46, 10-26-2018 via [place] Age: 85 y.o. Weight: 92.5 kg  Prescription refill sent.

## 2018-12-24 DIAGNOSIS — M19011 Primary osteoarthritis, right shoulder: Secondary | ICD-10-CM | POA: Diagnosis not present

## 2019-03-15 ENCOUNTER — Ambulatory Visit: Payer: Medicare Other | Attending: Internal Medicine

## 2019-03-15 DIAGNOSIS — Z23 Encounter for immunization: Secondary | ICD-10-CM | POA: Insufficient documentation

## 2019-03-15 NOTE — Progress Notes (Signed)
   Covid-19 Vaccination Clinic  Name:  Joseph Leach    MRN: [id number] DOB: 12/02/1925  03/15/2019  Joseph Leach was observed post Covid-19 immunization for wated 15 minutes without incidence. He was provided with Vaccine Information Sheet and instruction to access the V-Safe system.   Joseph Leach was instructed to call 911 with any severe reactions post vaccine: Marland Kitchen Difficulty breathing  . Swelling of your face and throat  . A fast heartbeat  . A bad rash all over your body  . Dizziness and weakness    Immunizations Administered    Name Date Dose VIS Date Route   Pfizer COVID-19 Vaccine 03/15/2019  9:05 AM 0.3 mL 12/28/2018 Intramuscular   Manufacturer: [place]   Lot: [id number]   [place]: [id number]

## 2019-04-09 ENCOUNTER — Ambulatory Visit: Payer: Medicare Other | Attending: Internal Medicine

## 2019-04-09 DIAGNOSIS — Z23 Encounter for immunization: Secondary | ICD-10-CM

## 2019-04-09 NOTE — Progress Notes (Signed)
   Covid-19 Vaccination Clinic  Name:  Joseph Leach    MRN: [id number] DOB: 1925-07-04  04/09/2019  [name] was observed post Covid-19 immunization for 15 minutes without incident. He was provided with Vaccine Information Sheet and instruction to access the V-Safe system.   [name] was instructed to call 911 with any severe reactions post vaccine: Marland Kitchen Difficulty breathing  . Swelling of face and throat  . A fast heartbeat  . A bad rash all over body  . Dizziness and weakness   Immunizations Administered    Name Date Dose VIS Date Route   Pfizer COVID-19 Vaccine 04/09/2019 10:40 AM 0.3 mL 12/28/2018 Intramuscular   Manufacturer: [place]   Lot: [id number]   [place]: [id number]

## 2019-04-12 DIAGNOSIS — M170 Bilateral primary osteoarthritis of knee: Secondary | ICD-10-CM | POA: Diagnosis not present

## 2019-05-01 DIAGNOSIS — M150 Primary generalized (osteo)arthritis: Secondary | ICD-10-CM | POA: Diagnosis not present

## 2019-05-01 DIAGNOSIS — E669 Obesity, unspecified: Secondary | ICD-10-CM | POA: Diagnosis not present

## 2019-05-01 DIAGNOSIS — Z6830 Body mass index (BMI) 30.0-30.9, adult: Secondary | ICD-10-CM | POA: Diagnosis not present

## 2019-05-01 DIAGNOSIS — M109 Gout, unspecified: Secondary | ICD-10-CM | POA: Diagnosis not present

## 2019-05-01 DIAGNOSIS — M1990 Unspecified osteoarthritis, unspecified site: Secondary | ICD-10-CM | POA: Diagnosis not present

## 2019-05-01 DIAGNOSIS — Z79899 Other long term (current) drug therapy: Secondary | ICD-10-CM | POA: Diagnosis not present

## 2019-05-01 DIAGNOSIS — M0609 Rheumatoid arthritis without rheumatoid factor, multiple sites: Secondary | ICD-10-CM | POA: Diagnosis not present

## 2019-05-01 DIAGNOSIS — M8580 Other specified disorders of bone density and structure, unspecified site: Secondary | ICD-10-CM | POA: Diagnosis not present

## 2019-05-09 DIAGNOSIS — H2513 Age-related nuclear cataract, bilateral: Secondary | ICD-10-CM | POA: Diagnosis not present

## 2019-05-23 DIAGNOSIS — Z01818 Encounter for other preprocedural examination: Secondary | ICD-10-CM | POA: Diagnosis not present

## 2019-05-23 DIAGNOSIS — H25812 Combined forms of age-related cataract, left eye: Secondary | ICD-10-CM | POA: Diagnosis not present

## 2019-06-07 DIAGNOSIS — H2512 Age-related nuclear cataract, left eye: Secondary | ICD-10-CM | POA: Diagnosis not present

## 2019-06-07 DIAGNOSIS — H25812 Combined forms of age-related cataract, left eye: Secondary | ICD-10-CM | POA: Diagnosis not present

## 2019-06-13 DIAGNOSIS — Z79899 Other long term (current) drug therapy: Secondary | ICD-10-CM | POA: Diagnosis not present

## 2019-06-13 DIAGNOSIS — M109 Gout, unspecified: Secondary | ICD-10-CM | POA: Diagnosis not present

## 2019-06-20 ENCOUNTER — Telehealth: Payer: Self-pay | Admitting: Hematology

## 2019-06-20 NOTE — Telephone Encounter (Signed)
Received a new hem referral from [facility] for abnl cbc (leukopenia w/blasts). Joseph Leach has been scheduled to see Joseph Leach on 6/8 at 11am. Appt date and time has been given to the pt's son.

## 2019-06-24 DIAGNOSIS — H2511 Age-related nuclear cataract, right eye: Secondary | ICD-10-CM | POA: Diagnosis not present

## 2019-06-24 DIAGNOSIS — H25811 Combined forms of age-related cataract, right eye: Secondary | ICD-10-CM | POA: Diagnosis not present

## 2019-06-25 ENCOUNTER — Other Ambulatory Visit: Payer: Self-pay

## 2019-06-25 ENCOUNTER — Inpatient Hospital Stay: Payer: Medicare Other

## 2019-06-25 ENCOUNTER — Inpatient Hospital Stay: Payer: Medicare Other | Attending: Hematology | Admitting: Hematology

## 2019-06-25 VITALS — BP 162/71 | HR 70 | Temp 98.1°F | Resp 16 | Wt 203.1 lb

## 2019-06-25 DIAGNOSIS — D729 Disorder of white blood cells, unspecified: Secondary | ICD-10-CM | POA: Diagnosis not present

## 2019-06-25 DIAGNOSIS — M0600 Rheumatoid arthritis without rheumatoid factor, unspecified site: Secondary | ICD-10-CM | POA: Insufficient documentation

## 2019-06-25 DIAGNOSIS — R799 Abnormal finding of blood chemistry, unspecified: Secondary | ICD-10-CM | POA: Diagnosis not present

## 2019-06-25 DIAGNOSIS — C61 Malignant neoplasm of prostate: Secondary | ICD-10-CM | POA: Insufficient documentation

## 2019-06-25 DIAGNOSIS — Z79899 Other long term (current) drug therapy: Secondary | ICD-10-CM | POA: Insufficient documentation

## 2019-06-25 DIAGNOSIS — I129 Hypertensive chronic kidney disease with stage 1 through stage 4 chronic kidney disease, or unspecified chronic kidney disease: Secondary | ICD-10-CM | POA: Insufficient documentation

## 2019-06-25 DIAGNOSIS — N183 Chronic kidney disease, stage 3 unspecified: Secondary | ICD-10-CM | POA: Diagnosis not present

## 2019-06-25 DIAGNOSIS — Z7901 Long term (current) use of anticoagulants: Secondary | ICD-10-CM | POA: Insufficient documentation

## 2019-06-25 DIAGNOSIS — D7589 Other specified diseases of blood and blood-forming organs: Secondary | ICD-10-CM | POA: Insufficient documentation

## 2019-06-25 DIAGNOSIS — D72829 Elevated white blood cell count, unspecified: Secondary | ICD-10-CM | POA: Insufficient documentation

## 2019-06-25 DIAGNOSIS — M109 Gout, unspecified: Secondary | ICD-10-CM | POA: Diagnosis not present

## 2019-06-25 LAB — CBC WITH DIFFERENTIAL/PLATELET
Abs Immature Granulocytes: 0.43 10*3/uL — ABNORMAL HIGH (ref 0.00–0.07)
Basophils Absolute: 0 10*3/uL (ref 0.0–0.1)
Basophils Relative: 0 %
Eosinophils Absolute: 0 10*3/uL (ref 0.0–0.5)
Eosinophils Relative: 0 %
HCT: 39.4 % (ref 39.0–52.0)
Hemoglobin: 12.4 g/dL — ABNORMAL LOW (ref 13.0–17.0)
Immature Granulocytes: 4 %
Lymphocytes Relative: 15 %
Lymphs Abs: 1.6 10*3/uL (ref 0.7–4.0)
MCH: 32 pg (ref 26.0–34.0)
MCHC: 31.5 g/dL (ref 30.0–36.0)
MCV: 101.5 fL — ABNORMAL HIGH (ref 80.0–100.0)
Monocytes Absolute: 0.8 10*3/uL (ref 0.1–1.0)
Monocytes Relative: 8 %
Neutro Abs: 7.8 10*3/uL — ABNORMAL HIGH (ref 1.7–7.7)
Neutrophils Relative %: 73 %
Platelets: 167 10*3/uL (ref 150–400)
RBC: 3.88 MIL/uL — ABNORMAL LOW (ref 4.22–5.81)
RDW: 15.9 % — ABNORMAL HIGH (ref 11.5–15.5)
WBC: 10.7 10*3/uL — ABNORMAL HIGH (ref 4.0–10.5)
nRBC: 0 % (ref 0.0–0.2)

## 2019-06-25 LAB — CMP (CANCER CENTER ONLY)
ALT: 15 U/L (ref 0–44)
AST: 15 U/L (ref 15–41)
Albumin: 3.9 g/dL (ref 3.5–5.0)
Alkaline Phosphatase: 68 U/L (ref 38–126)
Anion gap: 9 (ref 5–15)
BUN: 35 mg/dL — ABNORMAL HIGH (ref 8–23)
CO2: 27 mmol/L (ref 22–32)
Calcium: 9.9 mg/dL (ref 8.9–10.3)
Chloride: 108 mmol/L (ref 98–111)
Creatinine: 1.8 mg/dL — ABNORMAL HIGH (ref 0.61–1.24)
GFR, Est AFR Am: 37 mL/min — ABNORMAL LOW (ref >60)
GFR, Estimated: 32 mL/min — ABNORMAL LOW (ref >60)
Glucose, Bld: 81 mg/dL (ref 70–99)
Potassium: 3.7 mmol/L (ref 3.5–5.1)
Sodium: 144 mmol/L (ref 135–145)
Total Bilirubin: 1 mg/dL (ref 0.3–1.2)
Total Protein: 6.2 g/dL — ABNORMAL LOW (ref 6.5–8.1)

## 2019-06-25 LAB — LACTATE DEHYDROGENASE: LDH: 191 U/L (ref 98–192)

## 2019-06-25 NOTE — Progress Notes (Signed)
HEMATOLOGY/ONCOLOGY CONSULTATION NOTE  Date of Service: 06/25/2019  Patient Care Team: Maurice Small, MD as PCP - General (Family Medicine)  CHIEF COMPLAINTS/PURPOSE OF CONSULTATION:  Leukopenia w/blasts  HISTORY OF PRESENTING ILLNESS:   Joseph Leach is a wonderful 86 y.o. male who has been referred to [place] by [name], PA for evaluation and management of leukopenia w/blasts. Pt is accompanied today by his son. The pt reports that he is doing well overall.   The pt reports that he sees [name], [place] for his seronegative rheumatoid arthritis. The only change that he has noticed recently is an increase in intermittent diarrhea and constipation. His son notes that the pt has had issues with constipation for years, starting directly after an episode of Gout, but thinks that the constipation may have worsened over the last few months. Pt denies any recent infections or any use of antibiotics.   Pt takes low-dose Prednisone for his RA, Allopurinol for his Gout, Carvedilol for his HTN, and Eliquis. His Gout has been well-controlled and he has not required Colchicine. Pt has received Cortisone injections in his knees on multiple occasions and once in his right shoulder. Pt and his wife walk up to a quarter of a mile per day when the weather is good.   Most recent lab results (06/13/2019) of CBC is as follows: all values are WNL except for RBC at 3.81, Hgb at 12.3, HCT at 37.4, MCV at 98, Abs Immature Granulocytes at 0.4K.  On review of systems, pt reports constipation, diarrhea and denies fevers, chills, night sweats, back pain, new bone pain, headaches, abnormal/excessive bleeding and any other symptoms.   On PMHx the pt reports Osteoarthritis, Rheumatoid Arthritis, Gout, Stage 3 CKD, Elevated C-reactive protein, HTN, Prostate cancer.  MEDICAL HISTORY:  Past Medical History:  Diagnosis Date  . Hypertension   Osteoarthritis Rheumatoid Arthritis Gout Stage 3 CKD Elevated C-reactive  protein Prostate Cancer   SURGICAL HISTORY: Past Surgical History:  Procedure Laterality Date  . APPENDECTOMY    . LIGAMENT REPAIR Right   . PROSTATECTOMY    . TOTAL HIP ARTHROPLASTY      SOCIAL HISTORY: Social History   Socioeconomic History  . Marital status: Married    Spouse name: Not on file  . Number of children: Not on file  . Years of education: Not on file  . Highest education level: Not on file  Occupational History  . Not on file  Tobacco Use  . Smoking status: Never Smoker  . Smokeless tobacco: Never Used  Substance and Sexual Activity  . Alcohol use: Not Currently    Comment: occasional  . Drug use: Never  . Sexual activity: Not on file  Other Topics Concern  . Not on file  Social History Narrative  . Not on file   Social Determinants of Health   Financial Resource Strain:   . Difficulty of Paying Living Expenses:   Food Insecurity:   . Worried About Charity fundraiser in the Last Year:   . Arboriculturist in the Last Year:   Transportation Needs:   . Film/video editor (Medical):   Marland Kitchen Lack of Transportation (Non-Medical):   Physical Activity:   . Days of Exercise per Week:   . Minutes of Exercise per Session:   Stress:   . Feeling of Stress :   Social Connections:   . Frequency of Communication with Friends and Family:   . Frequency of Social Gatherings with Friends and  Family:   . Attends Religious Services:   . Active Member of Clubs or Organizations:   . Attends Archivist Meetings:   Marland Kitchen Marital Status:   Intimate Partner Violence:   . Fear of Current or Ex-Partner:   . Emotionally Abused:   Marland Kitchen Physically Abused:   . Sexually Abused:     FAMILY HISTORY: No family history on file.  ALLERGIES:  has No Known Allergies.  MEDICATIONS:  Current Outpatient Medications  Medication Sig Dispense Refill  . allopurinol (ZYLOPRIM) 100 MG tablet Take 100 mg by mouth 2 (two) times daily.    Marland Kitchen apixaban (ELIQUIS) 5 MG TABS tablet  Take 1 tablet (5 mg total) by mouth 2 (two) times daily. 60 tablet 5  . carvedilol (COREG) 12.5 MG tablet Take 1 tablet in the AM and 1/2 tablet in the PM (Patient not taking: Reported on 03/22/2018)    . chlorthalidone (HYGROTON) 25 MG tablet TAKE 1/2 TABLET BY MOUTH DAILY 30 tablet 3  . colchicine 0.6 MG tablet Take 0.6 mg by mouth as needed. For gout flare    . predniSONE (DELTASONE) 10 MG tablet Take 7.5 mg by mouth daily with breakfast.      No current facility-administered medications for this visit.    REVIEW OF SYSTEMS:    10 Point review of Systems was done is negative except as noted above.  PHYSICAL EXAMINATION: ECOG PERFORMANCE STATUS: 2 - Symptomatic, <50% confined to bed  . Vitals:   06/25/19 1132  BP: (!) 162/71  Pulse: 70  Resp: 16  Temp: 98.1 F (36.7 C)  SpO2: 99%   Filed Weights   06/25/19 1132  Weight: 203 lb 1.6 oz (92.1 kg)   .Body mass index is 29.14 kg/m.  GENERAL:alert, in no acute distress and comfortable SKIN: no acute rashes, no significant lesions EYES: conjunctiva are pink and non-injected, sclera anicteric OROPHARYNX: MMM, no exudates, no oropharyngeal erythema or ulceration NECK: supple, no JVD LYMPH:  no palpable lymphadenopathy in the cervical, axillary or inguinal regions LUNGS: clear to auscultation b/l with normal respiratory effort HEART: regular rate & rhythm ABDOMEN:  normoactive bowel sounds , non tender, not distended. Extremity: 1+ pedal edema b/l PSYCH: alert & oriented x 3 with fluent speech NEURO: no focal motor/sensory deficits  LABORATORY DATA:  I have reviewed the data as listed  . CBC Latest Ref Rng & Units 06/25/2019 03/22/2018  WBC 4.0 - 10.5 K/uL 10.7(H) 7.2  Hemoglobin 13.0 - 17.0 g/dL 12.4(L) 12.3(L)  Hematocrit 39 - 52 % 39.4 40.0  Platelets 150 - 400 K/uL 167 170   . CBC    Component Value Date/Time   WBC 10.7 (H) 06/25/2019 1210   RBC 3.88 (L) 06/25/2019 1210   HGB 12.4 (L) 06/25/2019 1210   HCT 39.4  06/25/2019 1210   PLT 167 06/25/2019 1210   MCV 101.5 (H) 06/25/2019 1210   MCH 32.0 06/25/2019 1210   MCHC 31.5 06/25/2019 1210   RDW 15.9 (H) 06/25/2019 1210   LYMPHSABS 1.6 06/25/2019 1210   MONOABS 0.8 06/25/2019 1210   EOSABS 0.0 06/25/2019 1210   BASOSABS 0.0 06/25/2019 1210    . CMP Latest Ref Rng & Units 06/25/2019 03/22/2018 03/13/2018  Glucose 70 - 99 mg/dL 81 144(H) 88  BUN 8 - 23 mg/dL 35(H) 40(H) 38(H)  Creatinine 0.61 - 1.24 mg/dL 1.80(H) 1.60(H) 1.64(H)  Sodium 135 - 145 mmol/L 144 141 141  Potassium 3.5 - 5.1 mmol/L 3.7 3.6 3.7  Chloride 98 -  111 mmol/L 108 110 109  CO2 22 - 32 mmol/L 27 23 24   Calcium 8.9 - 10.3 mg/dL 9.9 9.5 9.8  Total Protein 6.5 - 8.1 g/dL 6.2(L) - -  Total Bilirubin 0.3 - 1.2 mg/dL 1.0 - -  Alkaline Phos 38 - 126 U/L 68 - -  AST 15 - 41 U/L 15 - -  ALT 0 - 44 U/L 15 - -     RADIOGRAPHIC STUDIES: I have personally reviewed the radiological images as listed and agreed with the findings in the report. No results found.  ASSESSMENT & PLAN:   86 yo with seronegative RA on prednisone with   1) Leucocytosis with immature granulocytic cells and few peripheral blasts 2) macrocytosis without Anemia PLAN: -Discussed patient's most recent labs from 06/13/2019, all values are WNL except for RBC at 3.81, Hgb at 12.3, HCT at 37.4, MCV at 98, Abs Immature Granulocytes at 0.4K. - No leukopenia evidenced on referral labs  -Advised pt that blasts are immature blood cells that are typically found in bone marrow, but some of these cells were found in his peripheral blood. These cells are often found in the blood due to inflammation or overproduction caused by other means.  -mild neutrophilia could be from steroids. -cannot r/o MDS vs MS/MPN -Discussed conservative approach of watching with labs vs aggressive approach of completing additional w/o, including BM Bx -Recommend pt use OTC probiotics to assist with constipation and diarrhea -Will get labs today  and consider BM Bx based on results -Will see back in 2 weeks via phone   FOLLOW UP: Labs today Phone visit in 2 weeks with Dr Irene Limbo  . Orders Placed This Encounter  Procedures  . CBC with Differential/Platelet    Standing Status:   Future    Number of Occurrences:   1    Standing Expiration Date:   06/24/2020  . CMP ([facility] only)    Standing Status:   Future    Number of Occurrences:   1    Standing Expiration Date:   06/24/2020  . Lactate dehydrogenase    Standing Status:   Future    Number of Occurrences:   1    Standing Expiration Date:   06/24/2020  . Flow Cytometry    Immature granulocytes and some blasts reports on peripheral blood r/o MPN/leukemia    Standing Status:   Future    Number of Occurrences:   1    Standing Expiration Date:   06/24/2020  . Myeloid Panel NGS    Standing Status:   Future    Number of Occurrences:   1    Standing Expiration Date:   06/24/2020    All of the patients questions were answered with apparent satisfaction. The patient knows to call the clinic with any problems, questions or concerns.  I spent 30 mins counseling the patient face to face. The total time spent in the appointment was 45 minutes and more than 50% was on counseling and direct patient cares.    Sullivan Lone MD [place] AAHIVMS [facility] Hematology/Oncology Physician [facility]  (Office):       [phone] (Work cell):  [phone] (Fax):           [phone]  06/25/2019 9:04 AM  I, Yevette Edwards, am acting as a scribe for Dr. Sullivan Lone.   .I have reviewed the above documentation for accuracy and completeness, and I agree with the above. Brunetta Genera MD

## 2019-07-02 LAB — SURGICAL PATHOLOGY

## 2019-07-03 ENCOUNTER — Encounter: Payer: Self-pay | Admitting: *Deleted

## 2019-07-03 DIAGNOSIS — M170 Bilateral primary osteoarthritis of knee: Secondary | ICD-10-CM | POA: Diagnosis not present

## 2019-07-03 LAB — FLOW CYTOMETRY

## 2019-07-04 LAB — MYELOID PANEL NGS

## 2019-07-08 ENCOUNTER — Telehealth: Payer: Self-pay | Admitting: Hematology

## 2019-07-08 NOTE — Telephone Encounter (Signed)
Scheduled per los, spoke with patient's son and patient will be notified.

## 2019-07-09 ENCOUNTER — Ambulatory Visit: Payer: Medicare Other | Admitting: Hematology

## 2019-07-10 ENCOUNTER — Ambulatory Visit
Admission: RE | Admit: 2019-07-10 | Discharge: 2019-07-10 | Disposition: A | Discharge status: Home / Self Care | Payer: Medicare Other | Source: Ambulatory Visit | Attending: Physician Assistant | Admitting: Physician Assistant

## 2019-07-10 ENCOUNTER — Other Ambulatory Visit: Payer: Self-pay

## 2019-07-10 DIAGNOSIS — Z7952 Long term (current) use of systemic steroids: Secondary | ICD-10-CM

## 2019-07-11 ENCOUNTER — Ambulatory Visit: Payer: Medicare Other | Admitting: Hematology

## 2019-07-11 NOTE — Progress Notes (Signed)
HEMATOLOGY/ONCOLOGY CONSULTATION NOTE  Date of Service: 07/12/2019  Patient Care Team: Joseph Small, MD as PCP - General (Family Medicine)  CHIEF COMPLAINTS/PURPOSE OF CONSULTATION:  Leukopenia w/blasts  HISTORY OF PRESENTING ILLNESS:  I connected with Joseph Leach on 07/12/19 at 12:20 PM EDT and verified that I am speaking with the correct person using two identifiers.   I discussed the limitations, risks, security and privacy concerns of performing an evaluation and management service by telemedicine and the availability of in-person appointments. I also discussed with the patient that there may be a patient responsible charge related to this service. The patient expressed understanding and agreed to proceed.   Other persons participating in the visit and their role in the encounter:     -Joseph Leach, Medical Scribe   Patient's location: Home  Provider's location: [place] [facility] [place] is a wonderful 86 y.o. male who has been referred to [place] by Joseph Chimes, PA for evaluation and management of leukopenia w/blasts. Pt is accompanied today by his son.  The patient's last visit with [place] was on 06/25/19. The pt reports that he is doing well overall.  The pt reports he is good. His son will talk to him and decide what next steps he would like to do.   Of note since the patient's last visit, pt has had Bone Density ([id number]) completed on 07/10/19 with results revealing "Patients with diagnosis of osteoporosis or at high risk for fracture should have regular bone mineral density tests. For patients eligible for Medicare, routine testing is allowed once every 2 years. The testing frequency can be increased to one year for patients who have rapidly progressing disease, those who are receiving or discontinuing medical therapy to restore bone mass, or have additional risk factors." Pt had Surgical Pathology [phone])  completed on 06/25/19 with results revealing  "No monoclonal B-cell, phenotypically aberrant T-cell, or distinct  blast population identified "  Lab results today (06/25/19) of CBC w/diff and CMP is as follows: all values are WNL except for WBC at 10.7, RBC at 3.88, Hemoglobin at 12.4, MCV at 101.5, RDW at 15.9, Neutro Abs at 7.8K, Abs Immature Granulocytes at 0.43K, BUN at 35, Creatinine at 1.80, Total Protein at 6.2, GFR, Est Non Af Am at 32, GFR, Est AFR Am at 37 06/25/19 of LDH at 191: WNL 06/25/19 of Myeloid Panel at Abnormal   MEDICAL HISTORY:  Past Medical History:  Diagnosis Date  . Hypertension   . Squamous cell carcinoma of skin 08/08/2017   in situ-left forearm (txpbx)  . Squamous cell carcinoma of skin 01/09/2019   in situ-left forearm (txpbx)  . Squamous cell carcinoma of skin 01/09/2019   in situ-right hand proximal (txpbx)  . Squamous cell carcinoma of skin 01/09/2019   in situ-right arm (txpbx)  Osteoarthritis Rheumatoid Arthritis Gout Stage 3 CKD Elevated C-reactive protein Prostate Cancer   SURGICAL HISTORY: Past Surgical History:  Procedure Laterality Date  . APPENDECTOMY    . LIGAMENT REPAIR Right   . PROSTATECTOMY    . TOTAL HIP ARTHROPLASTY      SOCIAL HISTORY: Social History   Socioeconomic History  . Marital status: Married    Spouse name: Not on file  . Number of children: Not on file  . Years of education: Not on file  . Highest education level: Not on file  Occupational History  . Not on file  Tobacco Use  . Smoking status: Never Smoker  .  Smokeless tobacco: Never Used  Vaping Use  . Vaping Use: Never used  Substance and Sexual Activity  . Alcohol use: Not Currently    Comment: occasional  . Drug use: Never  . Sexual activity: Not on file  Other Topics Concern  . Not on file  Social History Narrative  . Not on file   Social Determinants of Health   Financial Resource Strain:   . Difficulty of Paying Living Expenses:   Food Insecurity:   . Worried About Sales executive in the Last Year:   . Arboriculturist in the Last Year:   Transportation Needs:   . Film/video editor (Medical):   Marland Kitchen Lack of Transportation (Non-Medical):   Physical Activity:   . Days of Exercise per Week:   . Minutes of Exercise per Session:   Stress:   . Feeling of Stress :   Social Connections:   . Frequency of Communication with Friends and Family:   . Frequency of Social Gatherings with Friends and Family:   . Attends Religious Services:   . Active Member of Clubs or Organizations:   . Attends Archivist Meetings:   Marland Kitchen Marital Status:   Intimate Partner Violence:   . Fear of Current or Ex-Partner:   . Emotionally Abused:   Marland Kitchen Physically Abused:   . Sexually Abused:     FAMILY HISTORY: No family history on file.  ALLERGIES:  has No Known Allergies.  MEDICATIONS:  Current Outpatient Medications  Medication Sig Dispense Refill  . allopurinol (ZYLOPRIM) 100 MG tablet Take 100 mg by mouth 2 (two) times daily.    Marland Kitchen apixaban (ELIQUIS) 5 MG TABS tablet Take 1 tablet (5 mg total) by mouth 2 (two) times daily. 60 tablet 5  . carvedilol (COREG) 12.5 MG tablet Take 1 tablet in the AM and 1/2 tablet in the PM (Patient not taking: Reported on 03/22/2018)    . chlorthalidone (HYGROTON) 25 MG tablet TAKE 1/2 TABLET BY MOUTH DAILY 30 tablet 3  . colchicine 0.6 MG tablet Take 0.6 mg by mouth as needed. For gout flare    . predniSONE (DELTASONE) 10 MG tablet Take 7.5 mg by mouth daily with breakfast.      No current facility-administered medications for this visit.    REVIEW OF SYSTEMS:   A 10+ POINT REVIEW OF SYSTEMS WAS OBTAINED including neurology, dermatology, psychiatry, cardiac, respiratory, lymph, extremities, GI, GU, Musculoskeletal, constitutional, breasts, reproductive, HEENT.  All pertinent positives are noted in the HPI.  All others are negative.   PHYSICAL EXAMINATION: ECOG PERFORMANCE STATUS: 2 - Symptomatic, <50% confined to bed  . There were no  vitals filed for this visit. There were no vitals filed for this visit. .There is no height or weight on file to calculate BMI.  Tele-Health Visit   LABORATORY DATA:  I have reviewed the data as listed  . CBC Latest Ref Rng & Units 06/25/2019 03/22/2018  WBC 4.0 - 10.5 K/uL 10.7(H) 7.2  Hemoglobin 13.0 - 17.0 g/dL 12.4(L) 12.3(L)  Hematocrit 39 - 52 % 39.4 40.0  Platelets 150 - 400 K/uL 167 170   . CBC    Component Value Date/Time   WBC 10.7 (H) 06/25/2019 1210   RBC 3.88 (L) 06/25/2019 1210   HGB 12.4 (L) 06/25/2019 1210   HCT 39.4 06/25/2019 1210   PLT 167 06/25/2019 1210   MCV 101.5 (H) 06/25/2019 1210   MCH 32.0 06/25/2019 1210   MCHC 31.5  06/25/2019 1210   RDW 15.9 (H) 06/25/2019 1210   LYMPHSABS 1.6 06/25/2019 1210   MONOABS 0.8 06/25/2019 1210   EOSABS 0.0 06/25/2019 1210   BASOSABS 0.0 06/25/2019 1210    . CMP Latest Ref Rng & Units 06/25/2019 03/22/2018 03/13/2018  Glucose 70 - 99 mg/dL 81 144(H) 88  BUN 8 - 23 mg/dL 35(H) 40(H) 38(H)  Creatinine 0.61 - 1.24 mg/dL 1.80(H) 1.60(H) 1.64(H)  Sodium 135 - 145 mmol/L 144 141 141  Potassium 3.5 - 5.1 mmol/L 3.7 3.6 3.7  Chloride 98 - 111 mmol/L 108 110 109  CO2 22 - 32 mmol/L 27 23 24   Calcium 8.9 - 10.3 mg/dL 9.9 9.5 9.8  Total Protein 6.5 - 8.1 g/dL 6.2(L) - -  Total Bilirubin 0.3 - 1.2 mg/dL 1.0 - -  Alkaline Phos 38 - 126 U/L 68 - -  AST 15 - 41 U/L 15 - -  ALT 0 - 44 U/L 15 - -   06/25/19 of Myeloid Panel    RADIOGRAPHIC STUDIES: I have personally reviewed the radiological images as listed and agreed with the findings in the report. DG Bone Density  Result Date: 07/10/2019 EXAM: DUAL X-RAY ABSORPTIOMETRY (DXA) FOR BONE MINERAL DENSITY IMPRESSION: Referring Physician:  Rosita Kea Your patient completed a BMD test using Lunar IDXA DXA system ( analysis version: 16 ) manufactured by [employer]. Technologist: KT PATIENT: Name: Joseph Leach, Joseph Leach Patient ID: [id number] Birth Date: [birth year] Height: 68.5 in. Sex:  Male Measured: 07/10/2019 Weight: 202.8 lbs. Indications: Caucasian, Eliquis, Height Loss (781.91), History of Fracture (Adult) (V15.51), long term current use of systemic steriods, Prednisone, Right hip replacement, Secondary Osteoporosis, Glucocorticoids (Chronic) (255.41) Fractures: Shoulder Treatments: Vitamin D (E933.5) ASSESSMENT: The BMD measured at Femur Total is 0.808 g/cm2 with a T-score of -1.6 . This patient is considered OSTEOPENIC according to [facility]) criteria. The scan quality is limited by patient's mobility. Lumbar spine was not utilized due to advanced degenerative changes. Right femur was excluded due to surgical hardware. Patient does not meet criteria for FRAX due to patient's age. Site Region Measured Date Measured Age YA T-score BMD Significant CHANGE Left Femur Neck 07/10/2019 92.1 -1.6 0.820 g/cm2 Left Femur Total 07/10/2019 92.1 -1.6 0.808 g/cm2 Left Forearm Radius 33% 07[age]10/2019 92.1 0.0 1.006 g/cm2 World Health Organization [facility]) criteria for post-menopausal, Caucasian Women: Normal       T-score at or above -1 SD Osteopenia   T-score between -1 and -2.5 SD Osteoporosis T-score at or below -2.5 SD RECOMMENDATION: 1. All patients should optimize calcium and vitamin D intake. 2. Consider FDA approved medical therapies in postmenopausal women and men aged 56 years and older, based on the following: a. A hip or vertebral (clinical or morphometric) fracture b. T-score < or = -2.5 at the femoral neck or spine after appropriate evaluation to exclude secondary causes c. Low bone mass (T-score between -1.0 and -2.5 at the femoral neck or spine) and a 10 year probability of a hip fracture > or = 3% or a 10 year probability of a major osteoporosis-related fracture > or = 20% based on the [place]-adapted WHO algorithm d. Clinician judgment and/or patient preferences may indicate treatment for people with 10-year fracture probabilities above or below these levels FOLLOW-UP: Patients  with diagnosis of osteoporosis or at high risk for fracture should have regular bone mineral density tests. For patients eligible for Medicare, routine testing is allowed once every 2 years. The testing frequency can be increased to  one year for patients who have rapidly progressing disease, those who are receiving or discontinuing medical therapy to restore bone mass, or have additional risk factors. I have reviewed this report and agree with the above findings. Mark A. Thornton Papas, M.D. [facility] Radiology Electronically Signed   By: Lavonia Dana M.D.   On: 07/10/2019 17:27    ASSESSMENT & PLAN:   86 yo with seronegative RA on prednisone with   1) Leucocytosis with immature granulocytic cells and few peripheral blasts 2) macrocytosis without Anemia  PLAN: -Discussed pt labwork today, 06/25/19; of CBC w/diff and CMP is as follows: all values are WNL except for WBC at 10.7, RBC at 3.88, Hemoglobin at 12.4, MCV at 101.5, RDW at 15.9, Neutro Abs at 7.8K, Abs Immature Granulocytes at 0.43K, BUN at 35, Creatinine at 1.80, Total Protein at 6.2, GFR, Est Non Af Am at 32, GFR, Est AFR Am at 37 -Discussed 06/25/19 of LDH at 191: WNL -Discussed 06/25/19 of Myeloid Panel at Abnormal  -Discussed 07/10/19 of Bone Density ([id number]) -Discussed 06/25/19 of Surgical Pathology [phone])  -Advised pt that blasts are immature blood cells that are typically found in bone marrow, but some of these cells were found in his peripheral blood. These cells are often found in the blood due to inflammation or overproduction caused by other means.  -mild neutrophilia could be from steroids. -Advised on Flow Cytometry -no obvious clonal  population   -Advised acute leukemia -unlikely this  -Advised on MDS -most likely this -Advised for MDS will need bone marrow biopsy for definitive diagnosis  -Advised on bone marrow biopsy procedure  -Advised low grade vs high grade MDS -Advised on watching counts vs treatment options    -Discussed conservative approach of watching with labs vs aggressive approach of completing additional w/o, including BM Bx -Pt' son will talk to the pt and decide what they want to do and call back on Monday   FOLLOW UP: CT guided bone marrow aspiration and biopsy in 1 week Phone visit with Dr Irene Limbo in 2 weeks . No orders of the defined types were placed in this encounter.   The total time spent in the appt was 20 minutes and more than 50% was on counseling and direct patient cares.  All of the patient's questions were answered with apparent satisfaction. The patient knows to call the clinic with any problems, questions or concerns.   Sullivan Lone MD [place] AAHIVMS [facility] Hematology/Oncology Physician [facility]  (Office):       [phone] (Work cell):  [phone] (Fax):           [phone]  07/12/2019 1:45 PM  I, Joseph Leach am acting as a Education administrator for Dr. Sullivan Lone.   .I have reviewed the above documentation for accuracy and completeness, and I agree with the above. Brunetta Genera MD

## 2019-07-12 ENCOUNTER — Inpatient Hospital Stay (HOSPITAL_BASED_OUTPATIENT_CLINIC_OR_DEPARTMENT_OTHER): Payer: Medicare Other | Admitting: Hematology

## 2019-07-12 ENCOUNTER — Ambulatory Visit: Payer: Medicare Other | Admitting: Physician Assistant

## 2019-07-12 DIAGNOSIS — D729 Disorder of white blood cells, unspecified: Secondary | ICD-10-CM

## 2019-07-15 ENCOUNTER — Encounter: Payer: Self-pay | Admitting: Hematology

## 2019-07-18 ENCOUNTER — Telehealth: Payer: Self-pay | Admitting: Hematology

## 2019-07-18 ENCOUNTER — Other Ambulatory Visit: Payer: Self-pay | Admitting: Hematology

## 2019-07-18 DIAGNOSIS — D729 Disorder of white blood cells, unspecified: Secondary | ICD-10-CM

## 2019-07-18 NOTE — Telephone Encounter (Signed)
Scheduled appt per 7/1 schmessage - pt son is aware of appt

## 2019-07-24 ENCOUNTER — Encounter: Payer: Self-pay | Admitting: *Deleted

## 2019-07-24 ENCOUNTER — Other Ambulatory Visit: Payer: Self-pay | Admitting: Radiology

## 2019-07-25 ENCOUNTER — Encounter (HOSPITAL_COMMUNITY): Payer: Self-pay

## 2019-07-25 ENCOUNTER — Ambulatory Visit (HOSPITAL_COMMUNITY)
Admission: RE | Admit: 2019-07-25 | Discharge: 2019-07-25 | Disposition: A | Discharge status: Home / Self Care | Payer: Medicare Other | Source: Ambulatory Visit | Attending: Hematology | Admitting: Hematology

## 2019-07-25 ENCOUNTER — Other Ambulatory Visit: Payer: Self-pay

## 2019-07-25 ENCOUNTER — Telehealth: Payer: Self-pay | Admitting: *Deleted

## 2019-07-25 DIAGNOSIS — M7989 Other specified soft tissue disorders: Secondary | ICD-10-CM | POA: Diagnosis not present

## 2019-07-25 DIAGNOSIS — Z7901 Long term (current) use of anticoagulants: Secondary | ICD-10-CM | POA: Insufficient documentation

## 2019-07-25 DIAGNOSIS — I4891 Unspecified atrial fibrillation: Secondary | ICD-10-CM | POA: Insufficient documentation

## 2019-07-25 DIAGNOSIS — M109 Gout, unspecified: Secondary | ICD-10-CM | POA: Insufficient documentation

## 2019-07-25 DIAGNOSIS — Z79899 Other long term (current) drug therapy: Secondary | ICD-10-CM | POA: Diagnosis not present

## 2019-07-25 DIAGNOSIS — N183 Chronic kidney disease, stage 3 unspecified: Secondary | ICD-10-CM | POA: Diagnosis not present

## 2019-07-25 DIAGNOSIS — D72819 Decreased white blood cell count, unspecified: Secondary | ICD-10-CM | POA: Insufficient documentation

## 2019-07-25 DIAGNOSIS — R197 Diarrhea, unspecified: Secondary | ICD-10-CM | POA: Diagnosis not present

## 2019-07-25 DIAGNOSIS — I129 Hypertensive chronic kidney disease with stage 1 through stage 4 chronic kidney disease, or unspecified chronic kidney disease: Secondary | ICD-10-CM | POA: Insufficient documentation

## 2019-07-25 DIAGNOSIS — M069 Rheumatoid arthritis, unspecified: Secondary | ICD-10-CM | POA: Insufficient documentation

## 2019-07-25 DIAGNOSIS — D539 Nutritional anemia, unspecified: Secondary | ICD-10-CM | POA: Diagnosis not present

## 2019-07-25 DIAGNOSIS — D708 Other neutropenia: Secondary | ICD-10-CM | POA: Insufficient documentation

## 2019-07-25 DIAGNOSIS — R5383 Other fatigue: Secondary | ICD-10-CM | POA: Insufficient documentation

## 2019-07-25 DIAGNOSIS — D729 Disorder of white blood cells, unspecified: Secondary | ICD-10-CM

## 2019-07-25 DIAGNOSIS — Z859 Personal history of malignant neoplasm, unspecified: Secondary | ICD-10-CM | POA: Insufficient documentation

## 2019-07-25 DIAGNOSIS — D7589 Other specified diseases of blood and blood-forming organs: Secondary | ICD-10-CM | POA: Diagnosis not present

## 2019-07-25 LAB — CBC WITH DIFFERENTIAL/PLATELET
Abs Immature Granulocytes: 0.65 10*3/uL — ABNORMAL HIGH (ref 0.00–0.07)
Basophils Absolute: 0 10*3/uL (ref 0.0–0.1)
Basophils Relative: 0 %
Eosinophils Absolute: 0 10*3/uL (ref 0.0–0.5)
Eosinophils Relative: 0 %
HCT: 28.9 % — ABNORMAL LOW (ref 39.0–52.0)
Hemoglobin: 8.6 g/dL — ABNORMAL LOW (ref 13.0–17.0)
Immature Granulocytes: 7 %
Lymphocytes Relative: 18 %
Lymphs Abs: 1.8 10*3/uL (ref 0.7–4.0)
MCH: 31.6 pg (ref 26.0–34.0)
MCHC: 29.8 g/dL — ABNORMAL LOW (ref 30.0–36.0)
MCV: 106.3 fL — ABNORMAL HIGH (ref 80.0–100.0)
Monocytes Absolute: 0.7 10*3/uL (ref 0.1–1.0)
Monocytes Relative: 7 %
Neutro Abs: 6.7 10*3/uL (ref 1.7–7.7)
Neutrophils Relative %: 68 %
Platelets: 208 10*3/uL (ref 150–400)
RBC: 2.72 MIL/uL — ABNORMAL LOW (ref 4.22–5.81)
RDW: 16.9 % — ABNORMAL HIGH (ref 11.5–15.5)
WBC: 9.8 10*3/uL (ref 4.0–10.5)
nRBC: 0 % (ref 0.0–0.2)

## 2019-07-25 LAB — BASIC METABOLIC PANEL
Anion gap: 11 (ref 5–15)
BUN: 42 mg/dL — ABNORMAL HIGH (ref 8–23)
CO2: 25 mmol/L (ref 22–32)
Calcium: 9.6 mg/dL (ref 8.9–10.3)
Chloride: 106 mmol/L (ref 98–111)
Creatinine, Ser: 1.64 mg/dL — ABNORMAL HIGH (ref 0.61–1.24)
GFR calc Af Amer: 41 mL/min — ABNORMAL LOW (ref >60)
GFR calc non Af Amer: 36 mL/min — ABNORMAL LOW (ref >60)
Glucose, Bld: 102 mg/dL — ABNORMAL HIGH (ref 70–99)
Potassium: 4 mmol/L (ref 3.5–5.1)
Sodium: 142 mmol/L (ref 135–145)

## 2019-07-25 LAB — PROTIME-INR
INR: 1.2 (ref 0.8–1.2)
Prothrombin Time: 14.6 s (ref 11.4–15.2)

## 2019-07-25 MED ORDER — FLUMAZENIL 0.5 MG/5ML IV SOLN
INTRAVENOUS | Status: AC
  Filled 2019-07-25: qty 5

## 2019-07-25 MED ORDER — LIDOCAINE HCL (PF) 1 % IJ SOLN
INTRAMUSCULAR | Status: AC | PRN
  Administered 2019-07-25: 10 mL via INTRADERMAL

## 2019-07-25 MED ORDER — SODIUM CHLORIDE 0.9 % IV SOLN: INTRAVENOUS | Status: DC

## 2019-07-25 MED ORDER — NALOXONE HCL 0.4 MG/ML IJ SOLN
INTRAMUSCULAR | Status: AC
  Filled 2019-07-25: qty 1

## 2019-07-25 MED ORDER — MIDAZOLAM HCL 2 MG/2ML IJ SOLN
INTRAMUSCULAR | Status: AC | PRN
  Administered 2019-07-25: 1 mg via INTRAVENOUS
  Administered 2019-07-25: 0.5 mg via INTRAVENOUS

## 2019-07-25 MED ORDER — MIDAZOLAM HCL 2 MG/2ML IJ SOLN
INTRAMUSCULAR | Status: AC
  Filled 2019-07-25: qty 2

## 2019-07-25 MED ORDER — FENTANYL CITRATE (PF) 100 MCG/2ML IJ SOLN
INTRAMUSCULAR | Status: AC | PRN
  Administered 2019-07-25: 25 ug via INTRAVENOUS
  Administered 2019-07-25: 50 ug via INTRAVENOUS

## 2019-07-25 MED ORDER — FENTANYL CITRATE (PF) 100 MCG/2ML IJ SOLN
INTRAMUSCULAR | Status: AC
  Filled 2019-07-25: qty 2

## 2019-07-25 NOTE — H&P (Signed)
Chief Complaint: Patient was seen in consultation today for CT-guided bone marrow aspiration and biopsy.  Referring Physician(s): Brunetta Genera  Supervising Physician: Jacqulynn Cadet  Patient Status: [facility] - Out-pt  History of Present Illness: Joseph Leach is a 86 y.o. male with a medical history that includes squamous cell carcinoma, atrial fibrillation (eliquis), hypertension, gout, CKD III, prostate cancer, and rheumatoid arthritis. Recent lab work revealed leukopenia with blasts and the patient was referred to Dr. Irene Limbo with hematology/oncology.  Interventional Radiology has been asked to evaluate this patient for a CT-guided bone marrow aspiration and biopsy for further work-up and diagnosis.   Past Medical History:  Diagnosis Date  . Hypertension   . Squamous cell carcinoma of skin 08/08/2017   in situ-left forearm (txpbx)  . Squamous cell carcinoma of skin 01/09/2019   in situ-left forearm (txpbx)  . Squamous cell carcinoma of skin 01/09/2019   in situ-right hand proximal (txpbx)  . Squamous cell carcinoma of skin 01/09/2019   in situ-right arm (txpbx)    Past Surgical History:  Procedure Laterality Date  . APPENDECTOMY    . LIGAMENT REPAIR Right   . PROSTATECTOMY    . TOTAL HIP ARTHROPLASTY      Allergies: Patient has no known allergies.  Medications: Prior to Admission medications   Medication Sig Start Date End Date Taking? Authorizing Provider  allopurinol (ZYLOPRIM) 100 MG tablet Take 100 mg by mouth 2 (two) times daily.   Yes [provider]  apixaban (ELIQUIS) 5 MG TABS tablet Take 1 tablet (5 mg total) by mouth 2 (two) times daily. 11/29/18  Yes Sherran Needs, NP  carvedilol (COREG) 12.5 MG tablet Take 1 tablet in the AM and 1/2 tablet in the PM 02/19/18  Yes Sherran Needs, NP  chlorthalidone (HYGROTON) 25 MG tablet TAKE 1/2 TABLET BY MOUTH DAILY 08/31/18  Yes Sherran Needs, NP  colchicine 0.6 MG tablet Take 0.6 mg by  mouth as needed. For gout flare   Yes [provider]  predniSONE (DELTASONE) 10 MG tablet Take 7.5 mg by mouth daily with breakfast.    Yes [provider]     History reviewed. No pertinent family history.  Social History   Socioeconomic History  . Marital status: Married    Spouse name: Not on file  . Number of children: Not on file  . Years of education: Not on file  . Highest education level: Not on file  Occupational History  . Not on file  Tobacco Use  . Smoking status: Never Smoker  . Smokeless tobacco: Never Used  Vaping Use  . Vaping Use: Never used  Substance and Sexual Activity  . Alcohol use: Not Currently    Comment: occasional  . Drug use: Never  . Sexual activity: Not on file  Other Topics Concern  . Not on file  Social History Narrative  . Not on file   Social Determinants of Health   Financial Resource Strain:   . Difficulty of Paying Living Expenses:   Food Insecurity:   . Worried About Charity fundraiser in the Last Year:   . Arboriculturist in the Last Year:   Transportation Needs:   . Film/video editor (Medical):   Marland Kitchen Lack of Transportation (Non-Medical):   Physical Activity:   . Days of Exercise per Week:   . Minutes of Exercise per Session:   Stress:   . Feeling of Stress :   Social Connections:   .  Frequency of Communication with Friends and Family:   . Frequency of Social Gatherings with Friends and Family:   . Attends Religious Services:   . Active Member of Clubs or Organizations:   . Attends Archivist Meetings:   Marland Kitchen Marital Status:     Review of Systems: A 12 point ROS discussed and pertinent positives are indicated in the HPI above.  All other systems are negative.  Review of Systems  Constitutional: Positive for fatigue.  Respiratory: Negative for cough and shortness of breath.   Cardiovascular: Positive for leg swelling. Negative for chest pain.  Gastrointestinal: Positive for constipation  and diarrhea. Negative for abdominal pain, nausea and vomiting.       Intermittent diarrhea and constipation  Musculoskeletal: Negative for back pain.  Neurological: Negative for dizziness and headaches.    Vital Signs: BP (!) 167/81   Pulse 71   Temp 97.6 F (36.4 C) (Oral)   Resp 18   SpO2 99%   Physical Exam Constitutional:      General: He is not in acute distress. HENT:     Mouth/Throat:     Mouth: Mucous membranes are moist.  Cardiovascular:     Rate and Rhythm: Normal rate and regular rhythm.     Pulses: Normal pulses.     Heart sounds: Normal heart sounds.  Pulmonary:     Effort: Pulmonary effort is normal.     Breath sounds: Normal breath sounds.  Abdominal:     General: Bowel sounds are normal.     Palpations: Abdomen is soft.  Musculoskeletal:     Right lower leg: Edema present.     Left lower leg: Edema present.     Comments: +2 bilateral lower extremity edema  Skin:    General: Skin is warm and dry.  Neurological:     Mental Status: He is alert and oriented to person, place, and time.     Imaging: DG Bone Density  Result Date: 07/10/2019 EXAM: DUAL X-RAY ABSORPTIOMETRY (DXA) FOR BONE MINERAL DENSITY IMPRESSION: Referring Physician:  Rosita Kea Your patient completed a BMD test using Lunar IDXA DXA system ( analysis version: 16 ) manufactured by [employer]. Technologist: KT PATIENT: Name: Joseph Leach, Joseph Leach Patient ID: [id number] Birth Date: [birth year] Height: 68.5 in. Sex: Male Measured: 07/10/2019 Weight: 202.8 lbs. Indications: Caucasian, Eliquis, Height Loss (781.91), History of Fracture (Adult) (V15.51), long term current use of systemic steriods, Prednisone, Right hip replacement, Secondary Osteoporosis, Glucocorticoids (Chronic) (255.41) Fractures: Shoulder Treatments: Vitamin D (E933.5) ASSESSMENT: The BMD measured at Femur Total is 0.808 g/cm2 with a T-score of -1.6 . This patient is considered OSTEOPENIC according to [facility])  criteria. The scan quality is limited by patient's mobility. Lumbar spine was not utilized due to advanced degenerative changes. Right femur was excluded due to surgical hardware. Patient does not meet criteria for FRAX due to patient's age. Site Region Measured Date Measured Age YA T-score BMD Significant CHANGE Left Femur Neck 07/10/2019 92.1 -1.8 0.820 g/cm2 Left Femur Total 07/10/2019 92.1 -1.6 0.808 g/cm2 Left Forearm Radius 33% 07[age]10/2019 92.1 0.0 1.006 g/cm2 World Health Organization [facility]) criteria for post-menopausal, Caucasian Women: Normal       T-score at or above -1 SD Osteopenia   T-score between -1 and -2.5 SD Osteoporosis T-score at or below -2.5 SD RECOMMENDATION: 1. All patients should optimize calcium and vitamin D intake. 2. Consider FDA approved medical therapies in postmenopausal women and men aged 18 years and older, based  on the following: a. A hip or vertebral (clinical or morphometric) fracture b. T-score < or = -2.5 at the femoral neck or spine after appropriate evaluation to exclude secondary causes c. Low bone mass (T-score between -1.0 and -2.5 at the femoral neck or spine) and a 10 year probability of a hip fracture > or = 3% or a 10 year probability of a major osteoporosis-related fracture > or = 20% based on the [place]-adapted WHO algorithm d. Clinician judgment and/or patient preferences may indicate treatment for people with 10-year fracture probabilities above or below these levels FOLLOW-UP: Patients with diagnosis of osteoporosis or at high risk for fracture should have regular bone mineral density tests. For patients eligible for Medicare, routine testing is allowed once every 2 years. The testing frequency can be increased to one year for patients who have rapidly progressing disease, those who are receiving or discontinuing medical therapy to restore bone mass, or have additional risk factors. I have reviewed this report and agree with the above findings. Mark A. Thornton Papas, M.D.  [facility] Radiology Electronically Signed   By: Lavonia Dana M.D.   On: 07/10/2019 17:27    Labs:  CBC: Recent Labs    06/25/19 1210 07/25/19 1005  WBC 10.7* 9.8  HGB 12.4* 8.6*  HCT 39.4 28.9*  PLT 167 208    COAGS: No results for input(s): INR, APTT in the last 8760 hours.  BMP: Recent Labs    06/25/19 1210 07/25/19 1005  NA 144 142  K 3.7 4.0  CL 108 106  CO2 27 25  GLUCOSE 81 102*  BUN 35* 42*  CALCIUM 9.9 9.6  CREATININE 1.80* 1.64*  GFRNONAA 32* 36*  GFRAA 37* 41*    LIVER FUNCTION TESTS: Recent Labs    06/25/19 1210  BILITOT 1.0  AST 15  ALT 15  ALKPHOS 68  PROT 6.2*  ALBUMIN 3.9    Assessment and Plan:  Leukocytosis with immature granulocytic cells and peripheral blasts; macrocytosis without anemia: Joseph Leach, 86 year old male, presents today to the [place] [facility] department for a CT-guided bone marrow aspiration and biopsy.  Risks and benefits of a CT-guided bone marrow aspiration and biopsy were discussed with the patient and/or patient's family including, but not limited to bleeding, infection, damage to adjacent structures or low yield requiring additional tests.  All of the questions were answered and there is agreement to proceed.  He has been NPO. He does take Eliquis, last dose this morning. Labs and vitals have been reviewed.   Consent signed and in chart.  Thank you for this interesting consult.  I greatly enjoyed meeting Joseph Leach and look forward to participating in their care.  A copy of this report was sent to the requesting provider on this date.  Electronically Signed: Soyla Dryer, AGACNP-BC [phone] 07/25/2019, 10:43 AM   I spent a total of  30 Minutes   in face to face in clinical consultation, greater than 50% of which was counseling/coordinating care for CT-guided bone marrow aspiration and biopsy.

## 2019-07-25 NOTE — Procedures (Signed)
Interventional Radiology Procedure Note  Procedure: CT guided aspirate and core biopsy of right iliac bone Complications: None Recommendations: - Bedrest supine x 1 hrs - Hydrocodone PRN  Pain - Follow biopsy results  Signed,  Arlett Goold K. Makaley Storts, MD   

## 2019-07-25 NOTE — Discharge Instructions (Signed)
Bone Marrow Aspiration and Bone Marrow Biopsy, Adult, Care After This sheet gives you information about how to care for yourself after your procedure. Your health care provider may also give you more specific instructions. If you have problems or questions, contact your health care provider. What can I expect after the procedure? After the procedure, it is common to have:  Mild pain and tenderness.  Swelling.  Bruising. Follow these instructions at home: Puncture site care   Follow instructions from your health care provider about how to take care of the puncture site. Make sure you: ? Wash your hands with soap and water before and after you change your bandage (dressing). If soap and water are not available, use hand sanitizer. ? Change your dressing as told by your health care provider.  Check your puncture site every day for signs of infection. Check for: ? More redness, swelling, or pain. ? Fluid or blood. ? Warmth. ? Pus or a bad smell. Activity  Return to your normal activities as told by your health care provider. Ask your health care provider what activities are safe for you.  Do not lift anything that is heavier than 10 lb (4.5 kg), or the limit that you are told, until your health care provider says that it is safe.  Do not drive for 24 hours if you were given a sedative during your procedure. General instructions   Take over-the-counter and prescription medicines only as told by your health care provider.  Do not take baths, swim, or use a hot tub until your health care provider approves. Ask your health care provider if you may take showers. You may only be allowed to take sponge baths.  If directed, put ice on the affected area. To do this: ? Put ice in a plastic bag. ? Place a towel between your skin and the bag. ? Leave the ice on for 20 minutes, 2-3 times a day.  Keep all follow-up visits as told by your health care provider. This is important. Contact a  health care provider if:  Your pain is not controlled with medicine.  You have a fever.  You have more redness, swelling, or pain around the puncture site.  You have fluid or blood coming from the puncture site.  Your puncture site feels warm to the touch.  You have pus or a bad smell coming from the puncture site. Summary  After the procedure, it is common to have mild pain, tenderness, swelling, and bruising.  Follow instructions from your health care provider about how to take care of the puncture site and what activities are safe for you.  Take over-the-counter and prescription medicines only as told by your health care provider.  Contact a health care provider if you have any signs of infection, such as fluid or blood coming from the puncture site. This information is not intended to replace advice given to you by your health care provider. Make sure you discuss any questions you have with your health care provider. Document Revised: 05/22/2018 Document Reviewed: 05/22/2018 Elsevier Patient Education  [place]. Moderate Conscious Sedation, Adult, Care After These instructions provide you with information about caring for yourself after your procedure. Your health care provider may also give you more specific instructions. Your treatment has been planned according to current medical practices, but problems sometimes occur. Call your health care provider if you have any problems or questions after your procedure. What can I expect after the procedure? After your procedure,  it is common:  To feel sleepy for several hours.  To feel clumsy and have poor balance for several hours.  To have poor judgment for several hours.  To vomit if you eat too soon. Follow these instructions at home: For at least 24 hours after the procedure:   Do not: ? Participate in activities where you could fall or become injured. ? Drive. ? Use heavy machinery. ? Drink alcohol. ? Take  sleeping pills or medicines that cause drowsiness. ? Make important decisions or sign legal documents. ? Take care of children on your own.  Rest. Eating and drinking  Follow the diet recommended by your health care provider.  If you vomit: ? Drink water, juice, or soup when you can drink without vomiting. ? Make sure you have little or no nausea before eating solid foods. General instructions  Have a responsible adult stay with you until you are awake and alert.  Take over-the-counter and prescription medicines only as told by your health care provider.  If you smoke, do not smoke without supervision.  Keep all follow-up visits as told by your health care provider. This is important. Contact a health care provider if:  You keep feeling nauseous or you keep vomiting.  You feel light-headed.  You develop a rash.  You have a fever. Get help right away if:  You have trouble breathing. This information is not intended to replace advice given to you by your health care provider. Make sure you discuss any questions you have with your health care provider. Document Revised: 12/16/2016 Document Reviewed: 04/25/2015 Elsevier Patient Education  2020 [employer].

## 2019-07-25 NOTE — Telephone Encounter (Signed)
Contacted by Erline Levine w/[facility] to report incidental findings from today's CT guided bone marrow: "Incidentally, at the superior range of the images obtained to localize the right iliac bone there is an incompletely imaged approximately 12.5 x 20 cm cystic mass filling the right hemiabdomen. This could conceivably represent a very large cyst exophytic from the lower pole of the right kidney. However, other pathologic processes are not excluded. Recommend further evaluation with contrast-enhanced CT scan of the abdomen."    Dr. Irene Limbo informed.

## 2019-07-26 LAB — SURGICAL PATHOLOGY

## 2019-07-29 ENCOUNTER — Other Ambulatory Visit: Payer: Self-pay | Admitting: Hematology

## 2019-07-29 ENCOUNTER — Encounter: Payer: Self-pay | Admitting: Hematology

## 2019-07-29 DIAGNOSIS — R1907 Generalized intra-abdominal and pelvic swelling, mass and lump: Secondary | ICD-10-CM

## 2019-07-31 DIAGNOSIS — E782 Mixed hyperlipidemia: Secondary | ICD-10-CM | POA: Diagnosis not present

## 2019-07-31 DIAGNOSIS — R739 Hyperglycemia, unspecified: Secondary | ICD-10-CM | POA: Diagnosis not present

## 2019-07-31 DIAGNOSIS — I10 Essential (primary) hypertension: Secondary | ICD-10-CM | POA: Diagnosis not present

## 2019-07-31 DIAGNOSIS — R7309 Other abnormal glucose: Secondary | ICD-10-CM | POA: Diagnosis not present

## 2019-07-31 NOTE — Progress Notes (Signed)
HEMATOLOGY/ONCOLOGY CONSULTATION NOTE  Date of Service: 08/01/2019  Patient Care Team: Maurice Small, MD as PCP - General (Family Medicine)  CHIEF COMPLAINTS/PURPOSE OF CONSULTATION:  Leukopenia w/blasts  HISTORY OF PRESENTING ILLNESS:   Joseph Leach is a wonderful 86 y.o. male who has been referred to [place] by [name], PA for evaluation and management of leukopenia w/blasts. Pt is accompanied today by his son.  The patient's last visit with [place] was on 07/12/19. The pt reports that he is doing well overall.  The pt reports he is good. He feels better than last meeting. Pt has anxiety and worry which causes him not to sleep well. He is walking better and SOB is not as bad during this last week. Pt has not had any abdominal pain, or back pain. He is having black stools. Pt goes back and forth between constipation and diarrhea. Within the last month pt has SOB and he had a period he was not walking well but that has gotten better.   Of note since the patient's last visit, pt has had Surgical Pathology ([id number]) completed on 07/25/19 with results revealing "BONE MARROW, ASPIRATE, CLOT, CORE:  Hypercellular bone marrow with granulocytic hyperplasia  See comment PERIPHERAL BLOOD:  Macrocytic anemia Neutrophilic left shift " Pt had CT Bone Marrow Biopsy and Aspiration ([id number]) completed on 07/25/19 with results revealing "Technically successful CT-guided bone marrow biopsy and aspiration. Incidentally, at the superior range of the images obtained to localize the right iliac bone there is an incompletely imaged approximately 12.5 x 20 cm cystic mass filling the right hemiabdomen. This could conceivably represent a very large cyst exophytic from the lower pole of the right kidney. However, other pathologic processes are not excluded. Recommend further evaluation with contrast-enhanced CT scan of the abdomen." Pt had CT Biospy ([id number]) completed on 07/25/19 with results revealing  "Technically successful CT-guided bone marrow biopsy and aspiration. Incidentally, at the superior range of the images obtained to localize the right iliac bone there is an incompletely imaged approximately 12.5 x 20 cm cystic mass filling the right hemiabdomen. This could conceivably represent a very large cyst exophytic from the lower pole of the right kidney. However, other pathologic processes are not excluded. Recommend further evaluation with contrast-enhanced CT scan of the abdomen."  Lab results today (07/25/19) of CBC w/diff and CMP is as follows: all values are WNL except for RBC at 2.72, Hemoglobin at 8.6, HCT at 28.9, MCV at 106.3, MCHC at 29.8, RDW at 16.9, Abs immature Granulocytes at 0.65K, Glucose at 102, BUN at 42, Creatinine at 1.64, GFR, Est Non Af Am at 36, GFR, Est AFR Am at 41 07/25/19 of Protime-INR is as follows: all values are WNL  On review of systems, pt reports sleeping well, anxiety, black stool, anxiety and denies blood in stool, SOB, abominable pain, back pain, light headed, dizzy, urinary problems and any other symptoms.   MEDICAL HISTORY:  Past Medical History:  Diagnosis Date   Hypertension    Squamous cell carcinoma of skin 08/08/2017   in situ-left forearm (txpbx)   Squamous cell carcinoma of skin 01/09/2019   in situ-left forearm (txpbx)   Squamous cell carcinoma of skin 01/09/2019   in situ-right hand proximal (txpbx)   Squamous cell carcinoma of skin 01/09/2019   in situ-right arm (txpbx)  Osteoarthritis Rheumatoid Arthritis Gout Stage 3 CKD Elevated C-reactive protein Prostate Cancer   SURGICAL HISTORY: Past Surgical History:  Procedure Laterality Date   APPENDECTOMY  LIGAMENT REPAIR Right    PROSTATECTOMY     TOTAL HIP ARTHROPLASTY      SOCIAL HISTORY: Social History   Socioeconomic History   Marital status: Married    Spouse name: Not on file   Number of children: Not on file   Years of education: Not on file    Highest education level: Not on file  Occupational History   Not on file  Tobacco Use   Smoking status: Never Smoker   Smokeless tobacco: Never Used  Vaping Use   Vaping Use: Never used  Substance and Sexual Activity   Alcohol use: Not Currently    Comment: occasional   Drug use: Never   Sexual activity: Not on file  Other Topics Concern   Not on file  Social History Narrative   Not on file   Social Determinants of Health   Financial Resource Strain:    Difficulty of Paying Living Expenses:   Food Insecurity:    Worried About Charity fundraiser in the Last Year:    Arboriculturist in the Last Year:   Transportation Needs:    Film/video editor (Medical):    Lack of Transportation (Non-Medical):   Physical Activity:    Days of Exercise per Week:    Minutes of Exercise per Session:   Stress:    Feeling of Stress :   Social Connections:    Frequency of Communication with Friends and Family:    Frequency of Social Gatherings with Friends and Family:    Attends Religious Services:    Active Member of Clubs or Organizations:    Attends Music therapist:    Marital Status:   Intimate Partner Violence:    Fear of Current or Ex-Partner:    Emotionally Abused:    Physically Abused:    Sexually Abused:     FAMILY HISTORY: No family history on file.  ALLERGIES:  has No Known Allergies.  MEDICATIONS:  Current Outpatient Medications  Medication Sig Dispense Refill   allopurinol (ZYLOPRIM) 100 MG tablet Take 100 mg by mouth 2 (two) times daily.     apixaban (ELIQUIS) 5 MG TABS tablet Take 1 tablet (5 mg total) by mouth 2 (two) times daily. 60 tablet 5   carvedilol (COREG) 12.5 MG tablet Take 1 tablet in the AM and 1/2 tablet in the PM     chlorthalidone (HYGROTON) 25 MG tablet TAKE 1/2 TABLET BY MOUTH DAILY 30 tablet 3   colchicine 0.6 MG tablet Take 0.6 mg by mouth as needed. For gout flare     predniSONE (DELTASONE) 10  MG tablet Take 7.5 mg by mouth daily with breakfast.      No current facility-administered medications for this visit.    REVIEW OF SYSTEMS:   A 10+ POINT REVIEW OF SYSTEMS WAS OBTAINED including neurology, dermatology, psychiatry, cardiac, respiratory, lymph, extremities, GI, GU, Musculoskeletal, constitutional, breasts, reproductive, HEENT.  All pertinent positives are noted in the HPI.  All others are negative.   PHYSICAL EXAMINATION: ECOG PERFORMANCE STATUS: 2 - Symptomatic, <50% confined to bed  . Vitals:   08/01/19 1024  BP: (!) 164/81  Pulse: 73  Resp: 18  Temp: 97.7 F (36.5 C)  SpO2: 100%   Filed Weights   08/01/19 1024  Weight: 205 lb (93 kg)   .Body mass index is 29.41 kg/m.  GENERAL:alert, in no acute distress and comfortable SKIN: no acute rashes, no significant lesions EYES: conjunctiva are pink and non-injected,  sclera anicteric OROPHARYNX: MMM, no exudates, no oropharyngeal erythema or ulceration NECK: supple, no JVD LYMPH:  no palpable lymphadenopathy in the cervical, axillary or inguinal regions LUNGS: clear to auscultation b/l with normal respiratory effort HEART: regular rate & rhythm ABDOMEN:  normoactive bowel sounds , non tender, not distended. Extremity: no pedal edema PSYCH: alert & oriented x 3 with fluent speech NEURO: no focal motor/sensory deficits  LABORATORY DATA:  I have reviewed the data as listed  . CBC Latest Ref Rng & Units 07/25/2019 06/25/2019 03/22/2018  WBC 4.0 - 10.5 K/uL 9.8 10.7(H) 7.2  Hemoglobin 13.0 - 17.0 g/dL 8.6(L) 12.4(L) 12.3(L)  Hematocrit 39 - 52 % 28.9(L) 39.4 40.0  Platelets 150 - 400 K/uL 208 167 170   . CBC    Component Value Date/Time   WBC 9.8 07/25/2019 1005   RBC 2.72 (L) 07/25/2019 1005   HGB 8.6 (L) 07/25/2019 1005   HCT 28.9 (L) 07/25/2019 1005   PLT 208 07/25/2019 1005   MCV 106.3 (H) 07/25/2019 1005   MCH 31.6 07/25/2019 1005   MCHC 29.8 (L) 07/25/2019 1005   RDW 16.9 (H) 07/25/2019 1005    LYMPHSABS 1.8 07/25/2019 1005   MONOABS 0.7 07/25/2019 1005   EOSABS 0.0 07/25/2019 1005   BASOSABS 0.0 07/25/2019 1005    . CMP Latest Ref Rng & Units 07/25/2019 06/25/2019 03/22/2018  Glucose 70 - 99 mg/dL 102(H) 81 144(H)  BUN 8 - 23 mg/dL 42(H) 35(H) 40(H)  Creatinine 0.61 - 1.24 mg/dL 1.64(H) 1.80(H) 1.60(H)  Sodium 135 - 145 mmol/L 142 144 141  Potassium 3.5 - 5.1 mmol/L 4.0 3.7 3.6  Chloride 98 - 111 mmol/L 106 108 110  CO2 22 - 32 mmol/L 25 27 23   Calcium 8.9 - 10.3 mg/dL 9.6 9.9 9.5  Total Protein 6.5 - 8.1 g/dL - 6.2(L) -  Total Bilirubin 0.3 - 1.2 mg/dL - 1.0 -  Alkaline Phos 38 - 126 U/L - 68 -  AST 15 - 41 U/L - 15 -  ALT 0 - 44 U/L - 15 -   06/25/19 of Myeloid Panel    RADIOGRAPHIC STUDIES: I have personally reviewed the radiological images as listed and agreed with the findings in the report. CT Biopsy  Result Date: 07/25/2019 INDICATION: 86 year old male with a history of squamous cell skin carcinoma and now abnormal white blood cell count (leukopenia). He presents for bone marrow biopsy. EXAM: CT GUIDED BONE MARROW ASPIRATION AND CORE BIOPSY Interventional Radiologist:  Criselda Peaches, MD MEDICATIONS: None. ANESTHESIA/SEDATION: Moderate (conscious) sedation was employed during this procedure. A total of 1.5 milligrams versed and 75 micrograms fentanyl were administered intravenously. The patient's level of consciousness and vital signs were monitored continuously by radiology nursing throughout the procedure under my direct supervision. Total monitored sedation time: 10 minutes FLUOROSCOPY TIME:  None. COMPLICATIONS: None immediate. Estimated blood loss: <25 mL PROCEDURE: Informed written consent was obtained from the patient after a thorough discussion of the procedural risks, benefits and alternatives. All questions were addressed. Maximal Sterile Barrier Technique was utilized including caps, mask, sterile gowns, sterile gloves, sterile drape, hand hygiene and skin  antiseptic. A timeout was performed prior to the initiation of the procedure. The patient was positioned prone and non-contrast localization CT was performed of the pelvis to demonstrate the iliac marrow spaces. Maximal barrier sterile technique utilized including caps, mask, sterile gowns, sterile gloves, large sterile drape, hand hygiene, and betadine prep. Under sterile conditions and local anesthesia, an 11 gauge coaxial bone  biopsy needle was advanced into the right iliac marrow space. Needle position was confirmed with CT imaging. Initially, bone marrow aspiration was performed. Next, the 11 gauge outer cannula was utilized to obtain a right iliac bone marrow core biopsy. Needle was removed. Hemostasis was obtained with compression. The patient tolerated the procedure well. Samples were prepared with the cytotechnologist. IMPRESSION: Technically successful CT-guided bone marrow biopsy and aspiration. Incidentally, at the superior range of the images obtained to localize the right iliac bone there is an incompletely imaged approximately 12.5 x 20 cm cystic mass filling the right hemiabdomen. This could conceivably represent a very large cyst exophytic from the lower pole of the right kidney. However, other pathologic processes are not excluded. Recommend further evaluation with contrast-enhanced CT scan of the abdomen. These results will be called to the ordering clinician or representative by the Radiologist Assistant, and communication documented in the PACS or [employer]. Signed, Criselda Peaches, MD, [place] [facility] Radiology Electronically Signed   By: Jacqulynn Cadet M.D.   On: 07/25/2019 15:44   DG Bone Density  Result Date: 07/10/2019 EXAM: DUAL X-RAY ABSORPTIOMETRY (DXA) FOR BONE MINERAL DENSITY IMPRESSION: Referring Physician:  Rosita Kea Your patient completed a BMD test using Lunar IDXA DXA system ( analysis version: 16 ) manufactured  by [employer]. Technologist: KT PATIENT: Name: Raiford, Fetterman Patient ID: [id number] Birth Date: 10-28-1925 Height: 68.5 in. Sex: Male Measured: 07/10/2019 Weight: 202.8 lbs. Indications: Caucasian, Eliquis, Height Loss (781.91), History of Fracture (Adult) (V15.51), long term current use of systemic steriods, Prednisone, Right hip replacement, Secondary Osteoporosis, Glucocorticoids (Chronic) (255.41) Fractures: Shoulder Treatments: Vitamin D (E933.5) ASSESSMENT: The BMD measured at Femur Total is 0.808 g/cm2 with a T-score of -1.6 . This patient is considered OSTEOPENIC according to [facility]) criteria. The scan quality is limited by patient's mobility. Lumbar spine was not utilized due to advanced degenerative changes. Right femur was excluded due to surgical hardware. Patient does not meet criteria for FRAX due to patient's age. Site Region Measured Date Measured Age YA T-score BMD Significant CHANGE Left Femur Neck 07/10/2019 92.1 -1.8 0.820 g/cm2 Left Femur Total 07/10/2019 92.1 -1.6 0.808 g/cm2 Left Forearm Radius 33% 07[age]10/2019 92.1 0.0 1.006 g/cm2 World Health Organization [facility]) criteria for post-menopausal, Caucasian Women: Normal       T-score at or above -1 SD Osteopenia   T-score between -1 and -2.5 SD Osteoporosis T-score at or below -2.5 SD RECOMMENDATION: 1. All patients should optimize calcium and vitamin D intake. 2. Consider FDA approved medical therapies in postmenopausal women and men aged 2 years and older, based on the following: a. A hip or vertebral (clinical or morphometric) fracture b. T-score < or = -2.5 at the femoral neck or spine after appropriate evaluation to exclude secondary causes c. Low bone mass (T-score between -1.0 and -2.5 at the femoral neck or spine) and a 10 year probability of a hip fracture > or = 3% or a 10 year probability of a major osteoporosis-related fracture > or = 20% based on the [place]-adapted WHO algorithm d. Clinician judgment and/or  patient preferences may indicate treatment for people with 10-year fracture probabilities above or below these levels FOLLOW-UP: Patients with diagnosis of osteoporosis or at high risk for fracture should have regular bone mineral density tests. For patients eligible for Medicare, routine testing is allowed once every 2 years. The testing frequency can be increased to one year for patients who have rapidly  progressing disease, those who are receiving or discontinuing medical therapy to restore bone mass, or have additional risk factors. I have reviewed this report and agree with the above findings. Mark A. Thornton Papas, M.D. [facility] Radiology Electronically Signed   By: Lavonia Dana M.D.   On: 07/10/2019 17:27   CT BONE MARROW BIOPSY & ASPIRATION  Result Date: 07/25/2019 INDICATION: 86 year old male with a history of squamous cell skin carcinoma and now abnormal white blood cell count (leukopenia). He presents for bone marrow biopsy. EXAM: CT GUIDED BONE MARROW ASPIRATION AND CORE BIOPSY Interventional Radiologist:  Criselda Peaches, MD MEDICATIONS: None. ANESTHESIA/SEDATION: Moderate (conscious) sedation was employed during this procedure. A total of 1.5 milligrams versed and 75 micrograms fentanyl were administered intravenously. The patient's level of consciousness and vital signs were monitored continuously by radiology nursing throughout the procedure under my direct supervision. Total monitored sedation time: 10 minutes FLUOROSCOPY TIME:  None. COMPLICATIONS: None immediate. Estimated blood loss: <25 mL PROCEDURE: Informed written consent was obtained from the patient after a thorough discussion of the procedural risks, benefits and alternatives. All questions were addressed. Maximal Sterile Barrier Technique was utilized including caps, mask, sterile gowns, sterile gloves, sterile drape, hand hygiene and skin antiseptic. A timeout was performed prior to the initiation of the procedure. The patient was  positioned prone and non-contrast localization CT was performed of the pelvis to demonstrate the iliac marrow spaces. Maximal barrier sterile technique utilized including caps, mask, sterile gowns, sterile gloves, large sterile drape, hand hygiene, and betadine prep. Under sterile conditions and local anesthesia, an 11 gauge coaxial bone biopsy needle was advanced into the right iliac marrow space. Needle position was confirmed with CT imaging. Initially, bone marrow aspiration was performed. Next, the 11 gauge outer cannula was utilized to obtain a right iliac bone marrow core biopsy. Needle was removed. Hemostasis was obtained with compression. The patient tolerated the procedure well. Samples were prepared with the cytotechnologist. IMPRESSION: Technically successful CT-guided bone marrow biopsy and aspiration. Incidentally, at the superior range of the images obtained to localize the right iliac bone there is an incompletely imaged approximately 12.5 x 20 cm cystic mass filling the right hemiabdomen. This could conceivably represent a very large cyst exophytic from the lower pole of the right kidney. However, other pathologic processes are not excluded. Recommend further evaluation with contrast-enhanced CT scan of the abdomen. These results will be called to the ordering clinician or representative by the Radiologist Assistant, and communication documented in the PACS or [employer]. Signed, Criselda Peaches, MD, [place] [facility] Radiology Electronically Signed   By: Jacqulynn Cadet M.D.   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  2) macrocytosis without Anemia  PLAN: -Discussed pt labwork today, 07/25/19; of CBC w/diff and CMP is as follows: all values are WNL except for RBC at 2.72, Hemoglobin at  8.6, HCT at 28.9, MCV at 106.3, MCHC at 29.8, RDW at 16.9, Abs immature Granulocytes at 0.65K, Glucose at 102, BUN at 42, Creatinine at 1.64, GFR, Est Non Af Am at 36, GFR, Est AFR Am at 41 -Discussed 07/25/19 of Protime-INR is as follows: all values are WNL -Discussed 07/25/19 of Surgical Pathology ([id number]), CT Bone Marrow Biopsy and Aspiration ([id number]), CT Biospy ([id number]) - consistent with Myeloid neoplasm -- MDS +/- MPN -Advised pt's  hemoglobin dropped quickly over the last month- possibility of bleeding  -Advised pt that blasts are immature blood cells that are typically found in bone marrow, but some of these cells were found in his peripheral blood. These cells are often found in the blood due to inflammation or overproduction caused by other means.  -Advised no acute leukemia -Advised on hypercellular bone marrow, some dysplasia (advised on myelodysplasia) -Advised on molecular testing -pt does have high risk mutations  -Advised on MDS -advised on low grade vs high grade  -Advised on watching counts vs treatment options (chemotherpay, Vidaza, recurrent blood transfusions) -Advised on possibility of GI workup  -Recommends holding blood thinner -Recommends complete CT scan as scheduled on 07/19 -Recommends stool testing to rule out GI bleeding -Recommends getting repeat blood count today  -Recommends if lightheaded, dizzy, abdominal pain, blood in stool go to ED  -Dr. Justin Mend PCP, and Dr. [name] at [facility] Rheumatology watch blood tests  -Recommends getting repeat labs in 3 weeks  -Will see back on 7/20 for a phone visit   FOLLOW UP: CT abd as scheduled on 7/19 Phone visit with Dr Irene Limbo on 7/20 after last scheduled patient . No orders of the defined types were placed in this encounter.   The total time spent in the appt was 30 minutes and more than 50% was on counseling and direct patient cares.  All of the patient's questions were answered with apparent  satisfaction. The patient knows to call the clinic with any problems, questions or concerns.  Sullivan Lone MD MS AAHIVMS [facility] Hematology/Oncology Physician [facility]  (Office):       [phone] (Work cell):  [phone] (Fax):           [phone]  08/01/2019 1:32 PM  I, Dawayne Cirri am acting as a scribe for Dr. Sullivan Lone.   .I have reviewed the above documentation for accuracy and completeness, and I agree with the above. Brunetta Genera MD

## 2019-08-01 ENCOUNTER — Encounter (HOSPITAL_COMMUNITY): Payer: Self-pay | Admitting: Hematology

## 2019-08-01 ENCOUNTER — Other Ambulatory Visit: Payer: Self-pay

## 2019-08-01 ENCOUNTER — Inpatient Hospital Stay: Payer: Medicare Other | Attending: Hematology | Admitting: Hematology

## 2019-08-01 VITALS — BP 164/81 | HR 73 | Temp 97.7°F | Resp 18 | Ht 70.0 in | Wt 205.0 lb

## 2019-08-01 DIAGNOSIS — D72819 Decreased white blood cell count, unspecified: Secondary | ICD-10-CM | POA: Diagnosis not present

## 2019-08-01 DIAGNOSIS — D469 Myelodysplastic syndrome, unspecified: Secondary | ICD-10-CM | POA: Diagnosis not present

## 2019-08-01 DIAGNOSIS — R1904 Left lower quadrant abdominal swelling, mass and lump: Secondary | ICD-10-CM | POA: Diagnosis not present

## 2019-08-01 DIAGNOSIS — D7589 Other specified diseases of blood and blood-forming organs: Secondary | ICD-10-CM | POA: Insufficient documentation

## 2019-08-02 ENCOUNTER — Telehealth: Payer: Self-pay | Admitting: Hematology

## 2019-08-02 ENCOUNTER — Ambulatory Visit (INDEPENDENT_AMBULATORY_CARE_PROVIDER_SITE_OTHER): Payer: Medicare Other | Admitting: Physician Assistant

## 2019-08-02 DIAGNOSIS — Z1283 Encounter for screening for malignant neoplasm of skin: Secondary | ICD-10-CM | POA: Diagnosis not present

## 2019-08-02 DIAGNOSIS — C4492 Squamous cell carcinoma of skin, unspecified: Secondary | ICD-10-CM

## 2019-08-02 DIAGNOSIS — L570 Actinic keratosis: Secondary | ICD-10-CM | POA: Diagnosis not present

## 2019-08-02 DIAGNOSIS — D0462 Carcinoma in situ of skin of left upper limb, including shoulder: Secondary | ICD-10-CM

## 2019-08-02 DIAGNOSIS — D0461 Carcinoma in situ of skin of right upper limb, including shoulder: Secondary | ICD-10-CM | POA: Diagnosis not present

## 2019-08-02 DIAGNOSIS — Z85828 Personal history of other malignant neoplasm of skin: Secondary | ICD-10-CM

## 2019-08-02 DIAGNOSIS — D485 Neoplasm of uncertain behavior of skin: Secondary | ICD-10-CM

## 2019-08-02 HISTORY — DX: Squamous cell carcinoma of skin, unspecified: C44.92

## 2019-08-02 NOTE — Patient Instructions (Signed)

## 2019-08-02 NOTE — Progress Notes (Signed)
Follow-Up Visit   Subjective  Joseph Leach is a 86 y.o. male who presents for the following: Follow-up (Spots of arms and hands - history of skin ca).   The following portions of the chart were reviewed this encounter and updated as appropriate: Tobacco  Allergies  Meds  Problems  Med Hx  Surg Hx  Fam Hx      Objective  Well appearing patient in no apparent distress; mood and affect are within normal limits.  A focused examination was performed including arms. Relevant physical exam findings are noted in the Assessment and Plan.  Objective  Right Wrist - Posterior: Hyperkeratotic scale with pink base      Objective  Right Forearm - Posterior: Hyperkeratotic scale with pink base      Objective  Left Forearm - Posterior: Hyperkeratotic scale with pink base      Objective  waist up: No atypical nevi   Assessment & Plan  Neoplasm of uncertain behavior of skin Right Wrist - Posterior  Skin / nail biopsy Type of biopsy: tangential   Informed consent: discussed and consent obtained   Timeout: patient name, date of birth, surgical site, and procedure verified   Anesthesia: the lesion was anesthetized in a standard fashion   Anesthetic:  1% lidocaine w/ epinephrine 1-100,000 local infiltration Instrument used: flexible razor blade   Hemostasis achieved with: aluminum chloride and electrodesiccation   Outcome: patient tolerated procedure well   Post-procedure details: wound care instructions given    Destruction of lesion Complexity: simple   Destruction method: electrodesiccation and curettage   Informed consent: discussed and consent obtained   Timeout:  patient name, date of birth, surgical site, and procedure verified Anesthesia: the lesion was anesthetized in a standard fashion   Anesthetic:  1% lidocaine w/ epinephrine 1-100,000 local infiltration Curettage performed in three different directions: Yes   Electrodesiccation performed over the  curetted area: Yes   Curettage cycles:  3 Lesion length (cm):  1.1 Lesion width (cm):  1.1 Margin per side (cm):  0.1 Final wound size (cm):  1.3 Hemostasis achieved with:  aluminum chloride Outcome: patient tolerated procedure well with no complications   Post-procedure details: wound care instructions given    Specimen 1 - Surgical pathology Differential Diagnosis: SCC vs  other Check Margins: No EDC today  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 Right Forearm - Posterior  Skin / nail biopsy Type of biopsy: tangential   Informed consent: discussed and consent obtained   Timeout: patient name, date of birth, surgical site, and procedure verified   Anesthesia: the lesion was anesthetized in a standard fashion   Anesthetic:  1% lidocaine w/ epinephrine 1-100,000 local infiltration Instrument used: flexible razor blade   Hemostasis achieved with: aluminum chloride and electrodesiccation   Outcome: patient tolerated procedure well   Post-procedure details: wound care instructions given    Destruction of lesion Complexity: simple   Destruction method: electrodesiccation and curettage   Informed consent: discussed and consent obtained   Timeout:  patient name, date of birth, surgical site, and procedure verified Anesthesia: the lesion was anesthetized in a standard fashion   Anesthetic:  1% lidocaine w/ epinephrine 1-100,000 local infiltration Curettage performed in three different directions: Yes   Electrodesiccation performed over the curetted area: Yes   Curettage cycles:  3 Lesion length (cm):  1 Lesion width (cm):  1 Margin per side (cm):  0.2 Final wound size (cm):  1.4 Hemostasis achieved with:  aluminum chloride Outcome: patient tolerated procedure well with no complications   Post-procedure details: wound care instructions given    Specimen 2 - Surgical pathology Differential Diagnosis: SCC vs other  Check Margins: No EDC  today  Carcinoma in situ of skin of left upper extremity including shoulder Left Forearm - Posterior  Skin / nail biopsy Type of biopsy: tangential   Informed consent: discussed and consent obtained   Timeout: patient name, date of birth, surgical site, and procedure verified   Anesthesia: the lesion was anesthetized in a standard fashion   Anesthetic:  1% lidocaine w/ epinephrine 1-100,000 local infiltration Instrument used: flexible razor blade   Hemostasis achieved with: aluminum chloride and electrodesiccation   Outcome: patient tolerated procedure well   Post-procedure details: wound care instructions given    Destruction of lesion Complexity: simple   Destruction method: electrodesiccation and curettage   Informed consent: discussed and consent obtained   Timeout:  patient name, date of birth, surgical site, and procedure verified Anesthesia: the lesion was anesthetized in a standard fashion   Anesthetic:  1% lidocaine w/ epinephrine 1-100,000 local infiltration Curettage performed in three different directions: Yes   Electrodesiccation performed over the curetted area: Yes   Curettage cycles:  3 Lesion length (cm):  1 Lesion width (cm):  1 Margin per side (cm):  0.2 Final wound size (cm):  1.4 Hemostasis achieved with:  aluminum chloride Outcome: patient tolerated procedure well with no complications   Post-procedure details: wound care instructions given    Specimen 3 - Surgical pathology Differential Diagnosis: SCC vs other  Check Margins: No EDC today  Screening exam for skin cancer waist up   I, Lyriq Finerty, PA-C, have reviewed all documentation's for this visit.  The documentation on 08/17/19 for the exam, diagnosis, procedures and orders are all accurate and complete.

## 2019-08-02 NOTE — Telephone Encounter (Signed)
Scheduled per los, spoke with patients son.  Patient will be notified.

## 2019-08-05 ENCOUNTER — Other Ambulatory Visit: Payer: Self-pay

## 2019-08-05 ENCOUNTER — Ambulatory Visit (HOSPITAL_COMMUNITY)
Admission: RE | Admit: 2019-08-05 | Discharge: 2019-08-05 | Disposition: A | Discharge status: Home / Self Care | Payer: Medicare Other | Source: Ambulatory Visit | Attending: Hematology | Admitting: Hematology

## 2019-08-05 ENCOUNTER — Encounter (HOSPITAL_COMMUNITY): Payer: Self-pay

## 2019-08-05 ENCOUNTER — Telehealth: Payer: Self-pay | Admitting: Hematology

## 2019-08-05 DIAGNOSIS — N281 Cyst of kidney, acquired: Secondary | ICD-10-CM | POA: Diagnosis not present

## 2019-08-05 DIAGNOSIS — R1907 Generalized intra-abdominal and pelvic swelling, mass and lump: Secondary | ICD-10-CM | POA: Insufficient documentation

## 2019-08-05 DIAGNOSIS — N2889 Other specified disorders of kidney and ureter: Secondary | ICD-10-CM | POA: Diagnosis not present

## 2019-08-05 DIAGNOSIS — K7689 Other specified diseases of liver: Secondary | ICD-10-CM | POA: Diagnosis not present

## 2019-08-05 DIAGNOSIS — Z0000 Encounter for general adult medical examination without abnormal findings: Secondary | ICD-10-CM | POA: Diagnosis not present

## 2019-08-05 DIAGNOSIS — I700 Atherosclerosis of aorta: Secondary | ICD-10-CM | POA: Diagnosis not present

## 2019-08-06 ENCOUNTER — Inpatient Hospital Stay (HOSPITAL_BASED_OUTPATIENT_CLINIC_OR_DEPARTMENT_OTHER): Payer: Medicare Other | Admitting: Hematology

## 2019-08-06 DIAGNOSIS — D469 Myelodysplastic syndrome, unspecified: Secondary | ICD-10-CM | POA: Diagnosis not present

## 2019-08-06 DIAGNOSIS — N2889 Other specified disorders of kidney and ureter: Secondary | ICD-10-CM

## 2019-08-06 DIAGNOSIS — C946 Myelodysplastic disease, not classified: Secondary | ICD-10-CM

## 2019-08-06 NOTE — Progress Notes (Signed)
HEMATOLOGY/ONCOLOGY CLINIC NOTE  Date of Service: 08/06/2019  Patient Care Team: Joseph Small, MD as PCP - General (Family Medicine)  CHIEF COMPLAINTS/PURPOSE OF CONSULTATION:  MDS Large renal cystic mass  HISTORY OF PRESENTING ILLNESS:  Joseph Leach is a wonderful 86 y.o. male who has been referred to [place] by Joseph Chimes, PA for evaluation and management of leukopenia w/blasts. Pt is accompanied today by his son. The pt reports that he is doing well overall.   The pt reports that he sees Joseph Leach, [place] for his seronegative rheumatoid arthritis. The only change that he has noticed recently is an increase in intermittent diarrhea and constipation. His son notes that the pt has had issues with constipation for years, starting directly after an episode of Gout, but thinks that the constipation may have worsened over the last few months. Pt denies any recent infections or any use of antibiotics.   Pt takes low-dose Prednisone for his RA, Allopurinol for his Gout, Carvedilol for his HTN, and Eliquis. His Gout has been well-controlled and he has not required Colchicine. Pt has received Cortisone injections in his knees on multiple occasions and once in his right shoulder. Pt and his wife walk up to a quarter of a mile per day when the weather is good.   Most recent lab results (06/13/2019) of CBC is as follows: all values are WNL except for RBC at 3.81, Hgb at 12.3, HCT at 37.4, MCV at 98, Abs Immature Granulocytes at 0.4K.  On review of systems, pt reports constipation, diarrhea and denies fevers, chills, night sweats, back pain, new bone pain, headaches, abnormal/excessive bleeding and any other symptoms.   On PMHx the pt reports Osteoarthritis, Rheumatoid Arthritis, Gout, Stage 3 CKD, Elevated C-reactive protein, HTN, Prostate cancer.  INTERVAL HISTORY:  I connected with  Joseph Leach on 08/06/19 by telephone and verified that I am speaking with the correct person using two  identifiers.   I discussed the limitations of evaluation and management by telemedicine. The patient expressed understanding and agreed to proceed.  Other persons participating in the visit and their role in the encounter:       -Joseph Leach, Medical Scribe      -Pt's son  Patient's location: Home Provider's location: [facility] is a wonderful 86 y.o. male who is here for evaluation and management of leukopenia w/blasts. The patient's last visit with [place] was on 08/01/2019. The pt reports that he is doing well overall.  The pt reports that the Dermatologist who removed his skin lesions on 08/02/2019 felt that all areas of concern were resected. Pt has continued to follow with Joseph Chimes, PA for his Rheumatoid Arthritis, which is currently stable.   Of note since the patient's last visit, pt has had Dermatopathology [phone]) completed on 08/02/2019 with results revealing "1. Skin , right wrist - posterior ACTINIC KERATOSIS 2. Skin , right forearm - posterior SQUAMOUS CELL CARCINOMA IN SITU 3. Skin , left forearm - posterior SQUAMOUS CELL CARCINOMA IN SITU".  Of note since the patient's last visit, pt has had CT Abd/Pel ([id number]) completed on 08/05/2019 with results revealing "1. Large complex cystic right renal mass. Malignancy cannot be excluded. No evidence of metastatic disease. 2. Moderate pericardial effusion. 3. Areas of nodular subpleural consolidation in the left lower lobe may be infectious/inflammatory in etiology. 4. Leach bilateral pleural effusions, right greater than left."  On review of systems, pt denies any other symptoms.  MEDICAL HISTORY:  Past Medical History:  Diagnosis Date  . Hypertension   . Squamous cell carcinoma of skin 08/08/2017   in situ-left forearm (txpbx)  . Squamous cell carcinoma of skin 01/09/2019   in situ-left forearm (txpbx)  . Squamous cell carcinoma of skin 01/09/2019   in situ-right hand proximal (txpbx)    . Squamous cell carcinoma of skin 01/09/2019   in situ-right arm (txpbx)  Osteoarthritis Rheumatoid Arthritis Gout Stage 3 CKD Elevated C-reactive protein Prostate Cancer   SURGICAL HISTORY: Past Surgical History:  Procedure Laterality Date  . APPENDECTOMY    . LIGAMENT REPAIR Right   . PROSTATECTOMY    . TOTAL HIP ARTHROPLASTY      SOCIAL HISTORY: Social History   Socioeconomic History  . Marital status: Married    Spouse name: Not on file  . Number of children: Not on file  . Years of education: Not on file  . Highest education level: Not on file  Occupational History  . Not on file  Tobacco Use  . Smoking status: Never Smoker  . Smokeless tobacco: Never Used  Vaping Use  . Vaping Use: Never used  Substance and Sexual Activity  . Alcohol use: Not Currently    Comment: occasional  . Drug use: Never  . Sexual activity: Not on file  Other Topics Concern  . Not on file  Social History Narrative  . Not on file   Social Determinants of Health   Financial Resource Strain:   . Difficulty of Paying Living Expenses:   Food Insecurity:   . Worried About Charity fundraiser in the Last Year:   . Arboriculturist in the Last Year:   Transportation Needs:   . Film/video editor (Medical):   Marland Kitchen Lack of Transportation (Non-Medical):   Physical Activity:   . Days of Exercise per Week:   . Minutes of Exercise per Session:   Stress:   . Feeling of Stress :   Social Connections:   . Frequency of Communication with Friends and Family:   . Frequency of Social Gatherings with Friends and Family:   . Attends Religious Services:   . Active Member of Clubs or Organizations:   . Attends Archivist Meetings:   Marland Kitchen Marital Status:   Intimate Partner Violence:   . Fear of Current or Ex-Partner:   . Emotionally Abused:   Marland Kitchen Physically Abused:   . Sexually Abused:     FAMILY HISTORY: No family history on file.  ALLERGIES:  has No Known  Allergies.  MEDICATIONS:  Current Outpatient Medications  Medication Sig Dispense Refill  . allopurinol (ZYLOPRIM) 100 MG tablet Take 100 mg by mouth 2 (two) times daily.    Marland Kitchen apixaban (ELIQUIS) 5 MG TABS tablet Take 1 tablet (5 mg total) by mouth 2 (two) times daily. 60 tablet 5  . carvedilol (COREG) 12.5 MG tablet Take 1 tablet in the AM and 1/2 tablet in the PM    . chlorthalidone (HYGROTON) 25 MG tablet TAKE 1/2 TABLET BY MOUTH DAILY 30 tablet 3  . colchicine 0.6 MG tablet Take 0.6 mg by mouth as needed. For gout flare    . diclofenac Sodium (VOLTAREN) 1 % GEL     . DICYCLOMINE HCL IM     . influenza vaccine adjuvanted (FLUAD QUADRIVALENT) 0.5 ML injection Fluad Quad 2020-2021(51yrup)(PF) 60 mcg (15 mcg x 4)/0.540mIM syringe  ADM 0.5ML IM UTD    . predniSONE (DELTASONE) 10  MG tablet Take 7.5 mg by mouth daily with breakfast.      No current facility-administered medications for this visit.    REVIEW OF SYSTEMS:   A 10+ POINT REVIEW OF SYSTEMS WAS OBTAINED including neurology, dermatology, psychiatry, cardiac, respiratory, lymph, extremities, GI, GU, Musculoskeletal, constitutional, breasts, reproductive, HEENT.  All pertinent positives are noted in the HPI.  All others are negative.   PHYSICAL EXAMINATION: ECOG PERFORMANCE STATUS: 2 - Symptomatic, <50% confined to bed  . There were no vitals filed for this visit. There were no vitals filed for this visit. .There is no height or weight on file to calculate BMI.  Telehealth visit   LABORATORY DATA:  I have reviewed the data as listed  . CBC Latest Ref Rng & Units 07/25/2019 06/25/2019 03/22/2018  WBC 4.0 - 10.5 K/uL 9.8 10.7(H) 7.2  Hemoglobin 13.0 - 17.0 g/dL 8.6(L) 12.4(L) 12.3(L)  Hematocrit 39 - 52 % 28.9(L) 39.4 40.0  Platelets 150 - 400 K/uL 208 167 170   . CBC    Component Value Date/Time   WBC 9.8 07/25/2019 1005   RBC 2.72 (L) 07/25/2019 1005   HGB 8.6 (L) 07/25/2019 1005   HCT 28.9 (L) 07/25/2019 1005   PLT  208 07/25/2019 1005   MCV 106.3 (H) 07/25/2019 1005   MCH 31.6 07/25/2019 1005   MCHC 29.8 (L) 07/25/2019 1005   RDW 16.9 (H) 07/25/2019 1005   LYMPHSABS 1.8 07/25/2019 1005   MONOABS 0.7 07/25/2019 1005   EOSABS 0.0 07/25/2019 1005   BASOSABS 0.0 07/25/2019 1005    . CMP Latest Ref Rng & Units 07/25/2019 06/25/2019 03/22/2018  Glucose 70 - 99 mg/dL 102(H) 81 144(H)  BUN 8 - 23 mg/dL 42(H) 35(H) 40(H)  Creatinine 0.61 - 1.24 mg/dL 1.64(H) 1.80(H) 1.60(H)  Sodium 135 - 145 mmol/L 142 144 141  Potassium 3.5 - 5.1 mmol/L 4.0 3.7 3.6  Chloride 98 - 111 mmol/L 106 108 110  CO2 22 - 32 mmol/L 25 27 23   Calcium 8.9 - 10.3 mg/dL 9.6 9.9 9.5  Total Protein 6.5 - 8.1 g/dL - 6.2(L) -  Total Bilirubin 0.3 - 1.2 mg/dL - 1.0 -  Alkaline Phos 38 - 126 U/L - 68 -  AST 15 - 41 U/L - 15 -  ALT 0 - 44 U/L - 15 -   08/02/2019 Dermatopathology [phone]   07/25/2019 Bone Marrow Report ([id number]):   07/25/2019 Cytogenetics:    06/25/19 of Myeloid Panel    RADIOGRAPHIC STUDIES: I have personally reviewed the radiological images as listed and agreed with the findings in the report. CT Abdomen Pelvis Wo Contrast  Result Date: 08/05/2019 CLINICAL DATA:  Abdominal mass. Rheumatoid arthritis. Leukocytosis. EXAM: CT ABDOMEN AND PELVIS WITHOUT CONTRAST TECHNIQUE: Multidetector CT imaging of the abdomen and pelvis was performed following the standard protocol without IV contrast. COMPARISON:  None. FINDINGS: Lower chest: Mild ground-glass in both lower lobes and lingula. Areas of nodular subpleural consolidation in the left lower lobe. Leach bilateral pleural effusions, right greater than left. Heart is enlarged. Decreased attenuation of the intravascular compartment is indicative of anemia. Moderate pericardial effusion. Atherosclerotic calcification of the aorta, aortic valve and coronary arteries. Hepatobiliary: Subcentimeter low-attenuation lesion in the dome of the liver is too Leach to  characterize. Liver and gallbladder are otherwise unremarkable. No biliary ductal dilatation. Pancreas: Negative. Spleen: Negative. Adrenals/Urinary Tract: There is a very large cystic mass extending from the lower pole right kidney, measuring 17.7 x 23.6 x 20.4 cm. Areas of  peripheral calcification with internal high attenuation are seen as well. Additional low and high attenuation lesions in the kidneys measure up to 6.0 cm on the left, incompletely assessed without post-contrast imaging. No urinary stones. Ureters are decompressed. Bladder is obscured by streak artifact from surgical clips in the right hip arthroplasty. Stomach/Bowel: Stomach, Leach bowel and colon are unremarkable. Appendix is reportedly surgically absent. Vascular/Lymphatic: Atherosclerotic calcification of the aorta. No pathologically enlarged lymph nodes. Reproductive: Prostatectomy per report. Other: No free fluid.  Mesenteries and peritoneum are unremarkable. Musculoskeletal: Right hip arthroplasty. Degenerative changes in the spine and left hip. No worrisome lytic or sclerotic lesions. Minimal grade 1 anterolisthesis of L4 on L5. IMPRESSION: 1. Large complex cystic right renal mass. Malignancy cannot be excluded. No evidence of metastatic disease. 2. Moderate pericardial effusion. 3. Areas of nodular subpleural consolidation in the left lower lobe may be infectious/inflammatory in etiology. Consider follow-up CT chest without contrast in 3-4 weeks in further evaluation, as clinically indicated. 4. Leach bilateral pleural effusions, right greater than left. 5.  Aortic atherosclerosis (ICD10-I70.0). Electronically Signed   By: Lorin Picket M.D.   On: 08/05/2019 09:03   CT Biopsy  Result Date: 07/25/2019 INDICATION: 86 year old male with a history of squamous cell skin carcinoma and now abnormal white blood cell count (leukopenia). He presents for bone marrow biopsy. EXAM: CT GUIDED BONE MARROW ASPIRATION AND CORE BIOPSY Interventional  Radiologist:  Criselda Peaches, MD MEDICATIONS: None. ANESTHESIA/SEDATION: Moderate (conscious) sedation was employed during this procedure. A total of 1.5 milligrams versed and 75 micrograms fentanyl were administered intravenously. The patient's level of consciousness and vital signs were monitored continuously by radiology nursing throughout the procedure under my direct supervision. Total monitored sedation time: 10 minutes FLUOROSCOPY TIME:  None. COMPLICATIONS: None immediate. Estimated blood loss: <25 mL PROCEDURE: Informed written consent was obtained from the patient after a thorough discussion of the procedural risks, benefits and alternatives. All questions were addressed. Maximal Sterile Barrier Technique was utilized including caps, mask, sterile gowns, sterile gloves, sterile drape, hand hygiene and skin antiseptic. A timeout was performed prior to the initiation of the procedure. The patient was positioned prone and non-contrast localization CT was performed of the pelvis to demonstrate the iliac marrow spaces. Maximal barrier sterile technique utilized including caps, mask, sterile gowns, sterile gloves, large sterile drape, hand hygiene, and betadine prep. Under sterile conditions and local anesthesia, an 11 gauge coaxial bone biopsy needle was advanced into the right iliac marrow space. Needle position was confirmed with CT imaging. Initially, bone marrow aspiration was performed. Next, the 11 gauge outer cannula was utilized to obtain a right iliac bone marrow core biopsy. Needle was removed. Hemostasis was obtained with compression. The patient tolerated the procedure well. Samples were prepared with the cytotechnologist. IMPRESSION: Technically successful CT-guided bone marrow biopsy and aspiration. Incidentally, at the superior range of the images obtained to localize the right iliac bone there is an incompletely imaged approximately 12.5 x 20 cm cystic mass filling the right hemiabdomen.  This could conceivably represent a very large cyst exophytic from the lower pole of the right kidney. However, other pathologic processes are not excluded. Recommend further evaluation with contrast-enhanced CT scan of the abdomen. These results will be called to the ordering clinician or representative by the Radiologist Assistant, and communication documented in the PACS or [employer]. Signed, Criselda Peaches, MD, [facility] Electronically Signed   By: Jacqulynn Cadet M.D.   On: 07/25/2019  15:44   DG Bone Density  Result Date: 07/10/2019 EXAM: DUAL X-RAY ABSORPTIOMETRY (DXA) FOR BONE MINERAL DENSITY IMPRESSION: Referring Physician:  Rosita Kea Your patient completed a BMD test using Lunar IDXA DXA system ( analysis version: 16 ) manufactured by [employer]. Technologist: KT PATIENT: Name: Torian, Quintero Patient ID: [id number] Birth Date: 1925/08/03 Height: 68.5 in. Sex: Male Measured: 07/10/2019 Weight: 202.8 lbs. Indications: Caucasian, Eliquis, Height Loss (781.91), History of Fracture (Adult) (V15.51), long term current use of systemic steriods, Prednisone, Right hip replacement, Secondary Osteoporosis, Glucocorticoids (Chronic) (255.41) Fractures: Shoulder Treatments: Vitamin D (E933.5) ASSESSMENT: The BMD measured at Femur Total is 0.808 g/cm2 with a T-score of -1.6 . This patient is considered OSTEOPENIC according to [facility]) criteria. The scan quality is limited by patient's mobility. Lumbar spine was not utilized due to advanced degenerative changes. Right femur was excluded due to surgical hardware. Patient does not meet criteria for FRAX due to patient's age. Site Region Measured Date Measured Age YA T-score BMD Significant CHANGE Left Femur Neck 07/10/2019 92.1 -1.8 0.820 g/cm2 Left Femur Total 07/10/2019 92.1 -1.6 0.808 g/cm2 Left Forearm Radius 33% 07[age]10/2019 92.1 0.0 1.006 g/cm2 World Health  Organization [facility]) criteria for post-menopausal, Caucasian Women: Normal       T-score at or above -1 SD Osteopenia   T-score between -1 and -2.5 SD Osteoporosis T-score at or below -2.5 SD RECOMMENDATION: 1. All patients should optimize calcium and vitamin D intake. 2. Consider FDA approved medical therapies in postmenopausal women and men aged 54 years and older, based on the following: a. A hip or vertebral (clinical or morphometric) fracture b. T-score < or = -2.5 at the femoral neck or spine after appropriate evaluation to exclude secondary causes c. Low bone mass (T-score between -1.0 and -2.5 at the femoral neck or spine) and a 10 year probability of a hip fracture > or = 3% or a 10 year probability of a major osteoporosis-related fracture > or = 20% based on the [place]-adapted WHO algorithm d. Clinician judgment and/or patient preferences may indicate treatment for people with 10-year fracture probabilities above or below these levels FOLLOW-UP: Patients with diagnosis of osteoporosis or at high risk for fracture should have regular bone mineral density tests. For patients eligible for Medicare, routine testing is allowed once every 2 years. The testing frequency can be increased to one year for patients who have rapidly progressing disease, those who are receiving or discontinuing medical therapy to restore bone mass, or have additional risk factors. I have reviewed this report and agree with the above findings. Mark A. Thornton Papas, M.D. [facility] Electronically Signed   By: Lavonia Dana M.D.   On: 07/10/2019 17:27   CT BONE MARROW BIOPSY & ASPIRATION  Result Date: 07/25/2019 INDICATION: 86 year old male with a history of squamous cell skin carcinoma and now abnormal white blood cell count (leukopenia). He presents for bone marrow biopsy. EXAM: CT GUIDED BONE MARROW ASPIRATION AND CORE BIOPSY Interventional Radiologist:  Criselda Peaches, MD MEDICATIONS: None. ANESTHESIA/SEDATION: Moderate (conscious)  sedation was employed during this procedure. A total of 1.5 milligrams versed and 75 micrograms fentanyl were administered intravenously. The patient's level of consciousness and vital signs were monitored continuously by radiology nursing throughout the procedure under my direct supervision. Total monitored sedation time: 10 minutes FLUOROSCOPY TIME:  None. COMPLICATIONS: None immediate. Estimated blood loss: <25 mL PROCEDURE: Informed written consent was obtained from the patient after a thorough discussion of the procedural risks, benefits  and alternatives. All questions were addressed. Maximal Sterile Barrier Technique was utilized including caps, mask, sterile gowns, sterile gloves, sterile drape, hand hygiene and skin antiseptic. A timeout was performed prior to the initiation of the procedure. The patient was positioned prone and non-contrast localization CT was performed of the pelvis to demonstrate the iliac marrow spaces. Maximal barrier sterile technique utilized including caps, mask, sterile gowns, sterile gloves, large sterile drape, hand hygiene, and betadine prep. Under sterile conditions and local anesthesia, an 11 gauge coaxial bone biopsy needle was advanced into the right iliac marrow space. Needle position was confirmed with CT imaging. Initially, bone marrow aspiration was performed. Next, the 11 gauge outer cannula was utilized to obtain a right iliac bone marrow core biopsy. Needle was removed. Hemostasis was obtained with compression. The patient tolerated the procedure well. Samples were prepared with the cytotechnologist. IMPRESSION: Technically successful CT-guided bone marrow biopsy and aspiration. Incidentally, at the superior range of the images obtained to localize the right iliac bone there is an incompletely imaged approximately 12.5 x 20 cm cystic mass filling the right hemiabdomen. This could conceivably represent a very large cyst exophytic from the lower pole of the right kidney.  However, other pathologic processes are not excluded. Recommend further evaluation with contrast-enhanced CT scan of the abdomen. These results will be called to the ordering clinician or representative by the Radiologist Assistant, and communication documented in the PACS or [employer]. Signed, Criselda Peaches, MD, [facility] Electronically Signed   By: Jacqulynn Cadet M.D.   On: 07/25/2019 15:44    ASSESSMENT & PLAN:   86 yo with seronegative RA on prednisone with   1) Leucocytosis with immature granulocytic cells and few peripheral blasts 2) macrocytosis Anemia 3) Large complex cystic rt renal mass PLAN: -Discussed 08/05/2019 CT Abd/Pel ([id number]) which revealed "1. Large complex cystic right renal mass. Malignancy cannot be excluded. No evidence of metastatic disease. 2. Moderate pericardial effusion. 3. Areas of nodular subpleural consolidation in the left lower lobe may be infectious/inflammatory in etiology. 4. Leach bilateral pleural effusions, right greater than left." -Discussed 08/02/2019 Dermatopathology [id number]) which revealed " 2. Skin , right forearm - posterior SQUAMOUS CELL CARCINOMA IN SITU 3. Skin , left forearm - posterior SQUAMOUS CELL CARCINOMA IN SITU". -Advised pt that rt renal cystic mass could either be a long-standing benign cyst or a malignant growth. -Advised pt that a CT is not the best method to visualize pericardial effusions. Further evaluation could be pursued by PCP with ECHO. -Advised pt that blood counts need to be monitored closely, as anemia was worsening on last labs.  -Will refer pt to [facility] for further evaluation and mx of rt renal cystic mass. -Recommend pt f/u with PCP closely for care coordination.  -Advised pt to go to ED immediately if any bloody/black stools observed.  -Will get labs in 1 week    FOLLOW UP: Labs in about 1 week Urology referral for mx  of rt renal mass RTC with Dr Irene Limbo in 3 weeks   The total time spent in the appt was 20 minutes and more than 50% was on counseling and direct patient cares.  All of the patient's questions were answered with apparent satisfaction. The patient knows to call the clinic with any problems, questions or concerns.   Sullivan Lone MD [place] AAHIVMS [facility] Hematology/Oncology Physician [facility]  (Office):       [phone] (Work cell):  [phone] (Fax):           [phone]  08/06/2019 8:56 AM  I, Joseph Leach, am acting as a scribe for Dr. Sullivan Lone.   .I have reviewed the above documentation for accuracy and completeness, and I agree with the above. Brunetta Genera MD

## 2019-08-11 ENCOUNTER — Encounter: Payer: Self-pay | Admitting: Hematology

## 2019-08-13 ENCOUNTER — Telehealth: Payer: Self-pay

## 2019-08-13 NOTE — Telephone Encounter (Signed)
-----   Message from Warren Danes, [place] sent at 08/08/2019 11:13 AM EDT ----- RTC if recur. 2 skin CA

## 2019-08-13 NOTE — Telephone Encounter (Signed)
PATH GIVEN TO SON LES FOLLOW PRN

## 2019-08-14 ENCOUNTER — Telehealth: Payer: Self-pay | Admitting: Hematology

## 2019-08-14 NOTE — Telephone Encounter (Signed)
Scheduled appt per 7/27 sch msg - pt son is aware of appts.

## 2019-08-17 ENCOUNTER — Encounter: Payer: Self-pay | Admitting: Physician Assistant

## 2019-08-20 ENCOUNTER — Other Ambulatory Visit: Payer: Self-pay

## 2019-08-20 ENCOUNTER — Inpatient Hospital Stay: Payer: Medicare Other | Attending: Hematology

## 2019-08-20 DIAGNOSIS — N281 Cyst of kidney, acquired: Secondary | ICD-10-CM | POA: Diagnosis not present

## 2019-08-20 DIAGNOSIS — D469 Myelodysplastic syndrome, unspecified: Secondary | ICD-10-CM | POA: Diagnosis not present

## 2019-08-20 DIAGNOSIS — D72828 Other elevated white blood cell count: Secondary | ICD-10-CM | POA: Insufficient documentation

## 2019-08-20 DIAGNOSIS — M0600 Rheumatoid arthritis without rheumatoid factor, unspecified site: Secondary | ICD-10-CM | POA: Insufficient documentation

## 2019-08-20 DIAGNOSIS — D49511 Neoplasm of unspecified behavior of right kidney: Secondary | ICD-10-CM | POA: Diagnosis not present

## 2019-08-20 DIAGNOSIS — N2889 Other specified disorders of kidney and ureter: Secondary | ICD-10-CM

## 2019-08-20 LAB — CMP (CANCER CENTER ONLY)
ALT: 13 U/L (ref 0–44)
AST: 13 U/L — ABNORMAL LOW (ref 15–41)
Albumin: 3.6 g/dL (ref 3.5–5.0)
Alkaline Phosphatase: 84 U/L (ref 38–126)
Anion gap: 8 (ref 5–15)
BUN: 33 mg/dL — ABNORMAL HIGH (ref 8–23)
CO2: 24 mmol/L (ref 22–32)
Calcium: 10.1 mg/dL (ref 8.9–10.3)
Chloride: 110 mmol/L (ref 98–111)
Creatinine: 1.61 mg/dL — ABNORMAL HIGH (ref 0.61–1.24)
GFR, Est AFR Am: 42 mL/min — ABNORMAL LOW (ref >60)
GFR, Estimated: 37 mL/min — ABNORMAL LOW (ref >60)
Glucose, Bld: 100 mg/dL — ABNORMAL HIGH (ref 70–99)
Potassium: 4 mmol/L (ref 3.5–5.1)
Sodium: 142 mmol/L (ref 135–145)
Total Bilirubin: 0.6 mg/dL (ref 0.3–1.2)
Total Protein: 6.2 g/dL — ABNORMAL LOW (ref 6.5–8.1)

## 2019-08-20 LAB — TYPE AND SCREEN
ABO/RH(D): O POS
Antibody Screen: NEGATIVE

## 2019-08-20 LAB — CBC WITH DIFFERENTIAL/PLATELET
Abs Immature Granulocytes: 0.26 10*3/uL — ABNORMAL HIGH (ref 0.00–0.07)
Basophils Absolute: 0 10*3/uL (ref 0.0–0.1)
Basophils Relative: 0 %
Eosinophils Absolute: 0.1 10*3/uL (ref 0.0–0.5)
Eosinophils Relative: 1 %
HCT: 33.3 % — ABNORMAL LOW (ref 39.0–52.0)
Hemoglobin: 10 g/dL — ABNORMAL LOW (ref 13.0–17.0)
Immature Granulocytes: 3 %
Lymphocytes Relative: 19 %
Lymphs Abs: 1.5 10*3/uL (ref 0.7–4.0)
MCH: 30.9 pg (ref 26.0–34.0)
MCHC: 30 g/dL (ref 30.0–36.0)
MCV: 102.8 fL — ABNORMAL HIGH (ref 80.0–100.0)
Monocytes Absolute: 0.7 10*3/uL (ref 0.1–1.0)
Monocytes Relative: 9 %
Neutro Abs: 5.2 10*3/uL (ref 1.7–7.7)
Neutrophils Relative %: 68 %
Platelets: 185 10*3/uL (ref 150–400)
RBC: 3.24 MIL/uL — ABNORMAL LOW (ref 4.22–5.81)
RDW: 15.9 % — ABNORMAL HIGH (ref 11.5–15.5)
WBC: 7.7 10*3/uL (ref 4.0–10.5)
nRBC: 0 % (ref 0.0–0.2)

## 2019-08-20 LAB — IRON AND TIBC
Iron: 28 ug/dL — ABNORMAL LOW (ref 42–163)
Saturation Ratios: 9 % — ABNORMAL LOW (ref 20–55)
TIBC: 305 ug/dL (ref 202–409)
UIBC: 278 ug/dL (ref 117–376)

## 2019-08-20 LAB — FERRITIN: Ferritin: 83 ng/mL (ref 24–336)

## 2019-09-03 ENCOUNTER — Other Ambulatory Visit: Payer: Self-pay | Admitting: *Deleted

## 2019-09-03 DIAGNOSIS — D469 Myelodysplastic syndrome, unspecified: Secondary | ICD-10-CM

## 2019-09-04 ENCOUNTER — Inpatient Hospital Stay: Payer: Medicare Other

## 2019-09-04 ENCOUNTER — Inpatient Hospital Stay (HOSPITAL_BASED_OUTPATIENT_CLINIC_OR_DEPARTMENT_OTHER): Payer: Medicare Other | Admitting: Hematology

## 2019-09-04 ENCOUNTER — Other Ambulatory Visit: Payer: Self-pay

## 2019-09-04 VITALS — BP 163/82 | HR 67 | Temp 97.6°F | Resp 18 | Ht 70.0 in | Wt 206.5 lb

## 2019-09-04 DIAGNOSIS — D469 Myelodysplastic syndrome, unspecified: Secondary | ICD-10-CM

## 2019-09-04 DIAGNOSIS — C946 Myelodysplastic disease, not classified: Secondary | ICD-10-CM

## 2019-09-04 DIAGNOSIS — N281 Cyst of kidney, acquired: Secondary | ICD-10-CM | POA: Diagnosis not present

## 2019-09-04 DIAGNOSIS — M0600 Rheumatoid arthritis without rheumatoid factor, unspecified site: Secondary | ICD-10-CM | POA: Diagnosis not present

## 2019-09-04 DIAGNOSIS — D72828 Other elevated white blood cell count: Secondary | ICD-10-CM | POA: Diagnosis not present

## 2019-09-04 LAB — SAMPLE TO BLOOD BANK

## 2019-09-04 LAB — RETICULOCYTES
Immature Retic Fract: 17.5 % — ABNORMAL HIGH (ref 2.3–15.9)
RBC.: 3.5 MIL/uL — ABNORMAL LOW (ref 4.22–5.81)
Retic Count, Absolute: 55.7 10*3/uL (ref 19.0–186.0)
Retic Ct Pct: 1.6 % (ref 0.4–3.1)

## 2019-09-04 LAB — CBC WITH DIFFERENTIAL (CANCER CENTER ONLY)
Abs Immature Granulocytes: 0.36 10*3/uL — ABNORMAL HIGH (ref 0.00–0.07)
Basophils Absolute: 0 10*3/uL (ref 0.0–0.1)
Basophils Relative: 0 %
Eosinophils Absolute: 0 10*3/uL (ref 0.0–0.5)
Eosinophils Relative: 1 %
HCT: 35.1 % — ABNORMAL LOW (ref 39.0–52.0)
Hemoglobin: 10.5 g/dL — ABNORMAL LOW (ref 13.0–17.0)
Immature Granulocytes: 4 %
Lymphocytes Relative: 15 %
Lymphs Abs: 1.3 10*3/uL (ref 0.7–4.0)
MCH: 30 pg (ref 26.0–34.0)
MCHC: 29.9 g/dL — ABNORMAL LOW (ref 30.0–36.0)
MCV: 100.3 fL — ABNORMAL HIGH (ref 80.0–100.0)
Monocytes Absolute: 0.6 10*3/uL (ref 0.1–1.0)
Monocytes Relative: 7 %
Neutro Abs: 6.4 10*3/uL (ref 1.7–7.7)
Neutrophils Relative %: 73 %
Platelet Count: 179 10*3/uL (ref 150–400)
RBC: 3.5 MIL/uL — ABNORMAL LOW (ref 4.22–5.81)
RDW: 15.9 % — ABNORMAL HIGH (ref 11.5–15.5)
WBC Count: 8.7 10*3/uL (ref 4.0–10.5)
nRBC: 0 % (ref 0.0–0.2)

## 2019-09-04 LAB — CMP (CANCER CENTER ONLY)
ALT: 11 U/L (ref 0–44)
AST: 13 U/L — ABNORMAL LOW (ref 15–41)
Albumin: 3.6 g/dL (ref 3.5–5.0)
Alkaline Phosphatase: 78 U/L (ref 38–126)
Anion gap: 8 (ref 5–15)
BUN: 32 mg/dL — ABNORMAL HIGH (ref 8–23)
CO2: 26 mmol/L (ref 22–32)
Calcium: 10.1 mg/dL (ref 8.9–10.3)
Chloride: 108 mmol/L (ref 98–111)
Creatinine: 1.51 mg/dL — ABNORMAL HIGH (ref 0.61–1.24)
GFR, Est AFR Am: 46 mL/min — ABNORMAL LOW (ref >60)
GFR, Estimated: 40 mL/min — ABNORMAL LOW (ref >60)
Glucose, Bld: 109 mg/dL — ABNORMAL HIGH (ref 70–99)
Potassium: 4.4 mmol/L (ref 3.5–5.1)
Sodium: 142 mmol/L (ref 135–145)
Total Bilirubin: 0.6 mg/dL (ref 0.3–1.2)
Total Protein: 6.2 g/dL — ABNORMAL LOW (ref 6.5–8.1)

## 2019-09-04 LAB — LACTATE DEHYDROGENASE: LDH: 199 U/L — ABNORMAL HIGH (ref 98–192)

## 2019-09-04 NOTE — Progress Notes (Signed)
HEMATOLOGY/ONCOLOGY CLINIC NOTE  Date of Service: 09/04/2019  Patient Care Team: Joseph Small, MD as PCP - General (Family Medicine)  CHIEF COMPLAINTS/PURPOSE OF CONSULTATION:  MDS Large renal cystic mass  HISTORY OF PRESENTING ILLNESS:  Joseph Leach is a wonderful 86 y.o. male who has been referred to [place] by Joseph Chimes, PA for evaluation and management of leukopenia w/blasts. Pt is accompanied today by his son. The pt reports that he is doing well overall.   The pt reports that he sees Joseph Leach, [place] for his seronegative rheumatoid arthritis. The only change that he has noticed recently is an increase in intermittent diarrhea and constipation. His son notes that the pt has had issues with constipation for years, starting directly after an episode of Gout, but thinks that the constipation may have worsened over the last few months. Pt denies any recent infections or any use of antibiotics.   Pt takes low-dose Prednisone for his RA, Allopurinol for his Gout, Carvedilol for his HTN, and Eliquis. His Gout has been well-controlled and he has not required Colchicine. Pt has received Cortisone injections in his knees on multiple occasions and once in his right shoulder. Pt and his wife walk up to a quarter of a mile per day when the weather is good.   Most recent lab results (06/13/2019) of CBC is as follows: all values are WNL except for RBC at 3.81, Hgb at 12.3, HCT at 37.4, MCV at 98, Abs Immature Granulocytes at 0.4K.  On review of systems, pt reports constipation, diarrhea and denies fevers, chills, night sweats, back pain, new bone pain, headaches, abnormal/excessive bleeding and any other symptoms.   On PMHx the pt reports Osteoarthritis, Rheumatoid Arthritis, Gout, Stage 3 CKD, Elevated C-reactive protein, HTN, Prostate cancer.  INTERVAL HISTORY: Joseph Leach is a wonderful 86 y.o. male who is here for evaluation and management of leukopenia w/blasts. We are joined  today by his son. The patient's last visit with [place] was on 08/06/2019. The pt reports that he is doing well overall.  The pt reports that he has been seen by Dr. Alinda Leach who evaluated his renal cyst and found that it is mostly fluid filled, but has solid components. Dr. Alinda Leach is ordering an abdominal MRI for further evaluation. Pt will see a Cardiologist, Dr. Harrell Leach, and a Pulmonologist in early September. Pt has been holding Eliquis for his imaging studies. He was started Eliquis for his Afib. Pt often bounces between constipation and loose stools.   Lab results today (09/04/19) of CBC w/diff and CMP is as follows: all values are WNL except for RBC at 3.50, Hgb at 10.5, HCT at 35.1, MCV at 100.3, MCHC at 29.9, RDW at 15.9, Abs Immature Granulocytes at 0.36K, Glucose at 109, BUN at 32, Creatinine at 1.51, Total Protein at 6.2, AST at 13, GFR Est Non Af Am at 40. 09/04/2019 LDH at 199 09/04/2019 Reticulocytes is as follows: Retic Ct Pct at 1.6, RBC at 3.50, Retic Ct Abs at 55.7, Immature Retic Fract at 17.5  On review of systems, pt reports SOB, loose stools, diarrhea and denies abdominal pain, black/bloody stools and any other symptoms.   MEDICAL HISTORY:  Past Medical History:  Diagnosis Date  . Hypertension   . SCC (squamous cell carcinoma) 08/02/2019   RIGHT FOREARM  (TX WITH BX)  . SCC (squamous cell carcinoma) 08/02/2019   LEFT FOREARM  (TX WITH BX)  . Squamous cell carcinoma of skin 08/08/2017   in situ-left  forearm (txpbx)  . Squamous cell carcinoma of skin 01/09/2019   in situ-left forearm (txpbx)  . Squamous cell carcinoma of skin 01/09/2019   in situ-right hand proximal (txpbx)  . Squamous cell carcinoma of skin 01/09/2019   in situ-right arm (txpbx)  Osteoarthritis Rheumatoid Arthritis Gout Stage 3 CKD Elevated C-reactive protein Prostate Cancer   SURGICAL HISTORY: Past Surgical History:  Procedure Laterality Date  . APPENDECTOMY    . LIGAMENT REPAIR Right   .  PROSTATECTOMY    . TOTAL HIP ARTHROPLASTY      SOCIAL HISTORY: Social History   Socioeconomic History  . Marital status: Married    Spouse name: Not on file  . Number of children: Not on file  . Years of education: Not on file  . Highest education level: Not on file  Occupational History  . Not on file  Tobacco Use  . Smoking status: Never Smoker  . Smokeless tobacco: Never Used  Vaping Use  . Vaping Use: Never used  Substance and Sexual Activity  . Alcohol use: Not Currently    Comment: occasional  . Drug use: Never  . Sexual activity: Not on file  Other Topics Concern  . Not on file  Social History Narrative  . Not on file   Social Determinants of Health   Financial Resource Strain:   . Difficulty of Paying Living Expenses:   Food Insecurity:   . Worried About Charity fundraiser in the Last Year:   . Arboriculturist in the Last Year:   Transportation Needs:   . Film/video editor (Medical):   Marland Kitchen Lack of Transportation (Non-Medical):   Physical Activity:   . Days of Exercise per Week:   . Minutes of Exercise per Session:   Stress:   . Feeling of Stress :   Social Connections:   . Frequency of Communication with Friends and Family:   . Frequency of Social Gatherings with Friends and Family:   . Attends Religious Services:   . Active Member of Clubs or Organizations:   . Attends Archivist Meetings:   Marland Kitchen Marital Status:   Intimate Partner Violence:   . Fear of Current or Ex-Partner:   . Emotionally Abused:   Marland Kitchen Physically Abused:   . Sexually Abused:     FAMILY HISTORY: No family history on file.  ALLERGIES:  has No Known Allergies.  MEDICATIONS:  Current Outpatient Medications  Medication Sig Dispense Refill  . allopurinol (ZYLOPRIM) 100 MG tablet Take 100 mg by mouth 2 (two) times daily.    Marland Kitchen apixaban (ELIQUIS) 5 MG TABS tablet Take 1 tablet (5 mg total) by mouth 2 (two) times daily. 60 tablet 5  . carvedilol (COREG) 12.5 MG tablet  Take 1 tablet in the AM and 1/2 tablet in the PM    . chlorthalidone (HYGROTON) 25 MG tablet TAKE 1/2 TABLET BY MOUTH DAILY 30 tablet 3  . colchicine 0.6 MG tablet Take 0.6 mg by mouth as needed. For gout flare    . diclofenac Sodium (VOLTAREN) 1 % GEL     . DICYCLOMINE HCL IM     . influenza vaccine adjuvanted (FLUAD QUADRIVALENT) 0.5 ML injection Fluad Quad 2020-2021(61yr up)(PF) 60 mcg (15 mcg x 4)/0.72mL IM syringe  ADM 0.5ML IM UTD    . predniSONE (DELTASONE) 10 MG tablet Take 7.5 mg by mouth daily with breakfast.      No current facility-administered medications for this visit.    REVIEW  OF SYSTEMS:   A 10+ POINT REVIEW OF SYSTEMS WAS OBTAINED including neurology, dermatology, psychiatry, cardiac, respiratory, lymph, extremities, GI, GU, Musculoskeletal, constitutional, breasts, reproductive, HEENT.  All pertinent positives are noted in the HPI.  All others are negative.   PHYSICAL EXAMINATION: ECOG PERFORMANCE STATUS: 2 - Symptomatic, <50% confined to bed  . Vitals:   09/04/19 1544  BP: (!) 163/82  Pulse: 67  Resp: 18  Temp: 97.6 F (36.4 C)  SpO2: 100%   Filed Weights   09/04/19 1544  Weight: 206 lb 8 oz (93.7 kg)   .Body mass index is 29.63 kg/m.  Exam was given in a chair   GENERAL:alert, in no acute distress and comfortable SKIN: no acute rashes, no significant lesions EYES: conjunctiva are pink and non-injected, sclera anicteric OROPHARYNX: MMM, no exudates, no oropharyngeal erythema or ulceration NECK: supple, no JVD LYMPH:  no palpable lymphadenopathy in the cervical, axillary or inguinal regions LUNGS: clear to auscultation b/l with normal respiratory effort HEART: regular rate & rhythm ABDOMEN:  normoactive bowel sounds , non tender, not distended. No palpable hepatosplenomegaly.  Extremity: no pedal edema PSYCH: alert & oriented x 3 with fluent speech NEURO: no focal motor/sensory deficits  LABORATORY DATA:  I have reviewed the data as  listed  . CBC Latest Ref Rng & Units 09/04/2019 08/20/2019 07/25/2019  WBC 4.0 - 10.5 K/uL 8.7 7.7 9.8  Hemoglobin 13.0 - 17.0 g/dL 10.5(L) 10.0(L) 8.6(L)  Hematocrit 39 - 52 % 35.1(L) 33.3(L) 28.9(L)  Platelets 150 - 400 K/uL 179 185 208   . CBC    Component Value Date/Time   WBC 8.7 09/04/2019 1441   WBC 7.7 08/20/2019 0926   RBC 3.50 (L) 09/04/2019 1441   RBC 3.50 (L) 09/04/2019 1440   HGB 10.5 (L) 09/04/2019 1441   HCT 35.1 (L) 09/04/2019 1441   PLT 179 09/04/2019 1441   MCV 100.3 (H) 09/04/2019 1441   MCH 30.0 09/04/2019 1441   MCHC 29.9 (L) 09/04/2019 1441   RDW 15.9 (H) 09/04/2019 1441   LYMPHSABS 1.3 09/04/2019 1441   MONOABS 0.6 09/04/2019 1441   EOSABS 0.0 09/04/2019 1441   BASOSABS 0.0 09/04/2019 1441    . CMP Latest Ref Rng & Units 09/04/2019 08/20/2019 07/25/2019  Glucose 70 - 99 mg/dL 109(H) 100(H) 102(H)  BUN 8 - 23 mg/dL 32(H) 33(H) 42(H)  Creatinine 0.61 - 1.24 mg/dL 1.51(H) 1.61(H) 1.64(H)  Sodium 135 - 145 mmol/L 142 142 142  Potassium 3.5 - 5.1 mmol/L 4.4 4.0 4.0  Chloride 98 - 111 mmol/L 108 110 106  CO2 22 - 32 mmol/L 26 24 25   Calcium 8.9 - 10.3 mg/dL 10.1 10.1 9.6  Total Protein 6.5 - 8.1 g/dL 6.2(L) 6.2(L) -  Total Bilirubin 0.3 - 1.2 mg/dL 0.6 0.6 -  Alkaline Phos 38 - 126 U/L 78 84 -  AST 15 - 41 U/L 13(L) 13(L) -  ALT 0 - 44 U/L 11 13 -   08/02/2019 Dermatopathology [phone]):   07/25/2019 Bone Marrow Report ([id number]):   07/25/2019 Cytogenetics:    06/25/19 Myeloid Panel NGS:   RADIOGRAPHIC STUDIES: I have personally reviewed the radiological images as listed and agreed with the findings in the report. No results found.  ASSESSMENT & PLAN:   86 yo with seronegative RA on prednisone with   1) Leucocytosis with immature granulocytic cells and few peripheral blasts 2) macrocytosis Anemia 3) Large complex cystic rt renal mass -08/05/2019 CT Abd/Pel ([id number])  revealed "1. Large complex cystic right  renal mass. Malignancy  cannot be excluded. No evidence of metastatic disease. 2. Moderate pericardial effusion. 3. Areas of nodular subpleural consolidation in the left lower lobe may be infectious/inflammatory in etiology. 4. Leach bilateral pleural effusions, right greater than left."  PLAN: -Discussed pt labwork today, 09/04/19; blood counts and chemistries are stable, LDH is borderline elevated, Immature Retic Fract at 17.5 -No lab or clinical evidence of MDS/MPN progression at this time. -Advised pt that if the Cardiologist would like for him to restart Eliquis, it is recommended to restart on a preventative dose.  -Advised pt that if GI bleeding is a concern would have to weigh the risks vs benefits of restarting Eliquis for Afib. -Advised pt that some of his SOB could be attributed to his renal cyst, due to it's size.  -Recommend pt f/u with Dr. Alinda Leach at [facility].  -Recommend pt f/u for Abdominal MRI as scheduled. -Will see back in 2 months with labs   FOLLOW UP: RTC with Dr Irene Limbo with labs in 2 months   The total time spent in the appt was 20 minutes and more than 50% was on counseling and direct patient cares.  All of the patient's questions were answered with apparent satisfaction. The patient knows to call the clinic with any problems, questions or concerns.    Sullivan Lone MD [place] AAHIVMS [facility] Hematology/Oncology Physician [facility]  (Office):       [phone] (Work cell):  [phone] (Fax):           [phone]  09/04/2019 8:36 PM  I, Yevette Edwards, am acting as a scribe for Dr. Sullivan Lone.   .I have reviewed the above documentation for accuracy and completeness, and I agree with the above. Brunetta Genera MD

## 2019-09-05 ENCOUNTER — Telehealth: Payer: Self-pay | Admitting: Hematology

## 2019-09-05 NOTE — Telephone Encounter (Signed)
Scheduled appointments per 8/18 los. Spoke with patient's son who is aware of appointment date and time.

## 2019-09-10 ENCOUNTER — Other Ambulatory Visit: Payer: Self-pay | Admitting: Urology

## 2019-09-10 DIAGNOSIS — D4101 Neoplasm of uncertain behavior of right kidney: Secondary | ICD-10-CM

## 2019-09-24 ENCOUNTER — Ambulatory Visit (INDEPENDENT_AMBULATORY_CARE_PROVIDER_SITE_OTHER): Payer: Medicare Other | Admitting: Pulmonary Disease

## 2019-09-24 ENCOUNTER — Encounter: Payer: Self-pay | Admitting: Pulmonary Disease

## 2019-09-24 ENCOUNTER — Other Ambulatory Visit: Payer: Self-pay

## 2019-09-24 VITALS — BP 126/72 | HR 76 | Temp 97.6°F | Ht 70.0 in | Wt 209.8 lb

## 2019-09-24 DIAGNOSIS — R0609 Other forms of dyspnea: Secondary | ICD-10-CM

## 2019-09-24 DIAGNOSIS — R0600 Dyspnea, unspecified: Secondary | ICD-10-CM | POA: Diagnosis not present

## 2019-09-24 NOTE — Patient Instructions (Signed)
Nice to meet you!  I think your shortness of breath may be related to several things. We will work to get more information. I would like to start with breathing tests that show the function of your lungs.  Please schedule PFTs in the coming weeks. Follow up to be determined based on the results.

## 2019-09-24 NOTE — Progress Notes (Signed)
Patient ID: Joseph Leach, male    DOB: 23-Apr-1925, 86 y.o.   MRN: [id number]  Chief Complaint  Patient presents with  . Consult    SOB and fatigue with activity    Referring provider: Maurice Small, MD  HPI:   This is a 86 year old male with past medical history of A. fib formally on Xarelto recently held with anemia, valvular dysfunction, small pericardial effusion, and blood abnormalities whom are seen in consultation at the request of Maurice Small, MD for evaluation of dyspnea on exertion.  Patient is unsure how long he has been short of breath.  He notes that really he is only noticed it during work-up for his low blood counts.  Previous is walking quite a mile a day with wife without issue.  Now that walking nearly as much.  Shortness of breath worse with exertion.  Not much shortness of breath at rest.  No other clear exacerbating or alleviating factors.  Overall, present for the last several months.  Son thinks that breathing has mildly improved over the last month or 2.  This correlates with stopping the Xarelto and with increase in blood counts as described below. He notes bilateral LE edema to mid 90s  Seen by oncology.  Notes reviewed.  Concern for leukemia or MDS.  Bone marrow results were reassuring.  He was noted to have a significant hemoglobin drop in July 2021.  Xarelto has been held since.  Hemoglobin haas had steadily risen from eights to the tens.  Baseline mid 12's. DOE seems better with improved Hgb. Work up included a bone marrow biopsy which during US revealed shadow in R abdomen. CT abd/pelvis demonstrated a large R renal cyst encompassing essentially entire R side of abdomen through pelvis. The resulting cuts in lungs on my interpretation show bilateral R>L pleural effusions, moderate size pericardial effusion, scattered small nodules/linear opacities in bilateral bases favored to be atelectasis/inflammatory as suspect was not a breath hold CT.   TTE 01/2018 reviewed  with diastolic dysfxn, severely dilated LA, small pericardial effusion.   PMH: HTN, SCC of skin (resected), diastolic dysfxn, anemia Surgical History:  Appendectomy, hip replacement Family History: No h/o lung cancer or lung disease Social History: Never smoker, retired worked at [employer], married  Licensed conveyancer / Pulmonary Flowsheets:   ACT:  No flowsheet data found.  MMRC: No flowsheet data found.  Epworth:  No flowsheet data found.  Tests:   FENO:  No results found for: NITRICOXIDE  PFT: No flowsheet data found.  WALK:  No flowsheet data found.  Imaging: Reviewed and as per discussion above  Lab Results:  CBC    Component Value Date/Time   WBC 8.7 09/04/2019 1441   WBC 7.7 08/20/2019 0926   RBC 3.50 (L) 09/04/2019 1441   RBC 3.50 (L) 09/04/2019 1440   HGB 10.5 (L) 09/04/2019 1441   HCT 35.1 (L) 09/04/2019 1441   PLT 179 09/04/2019 1441   MCV 100.3 (H) 09/04/2019 1441   MCH 30.0 09/04/2019 1441   MCHC 29.9 (L) 09/04/2019 1441   RDW 15.9 (H) 09/04/2019 1441   LYMPHSABS 1.3 09/04/2019 1441   MONOABS 0.6 09/04/2019 1441   EOSABS 0.0 09/04/2019 1441   BASOSABS 0.0 09/04/2019 1441    BMET    Component Value Date/Time   NA 142 09/04/2019 1441   K 4.4 09/04/2019 1441   CL 108 09/04/2019 1441   CO2 26 09/04/2019 1441   GLUCOSE 109 (H) 09/04/2019 1441   BUN 32 (  H) 09/04/2019 1441   CREATININE 1.51 (H) 09/04/2019 1441   CALCIUM 10.1 09/04/2019 1441   GFRNONAA 40 (L) 09/04/2019 1441   GFRAA 46 (L) 09/04/2019 1441    BNP No results found for: BNP  ProBNP No results found for: PROBNP  Specialty Problems    None      No Known Allergies  Immunization History  Administered Date(s) Administered  . PFIZER SARS-COV-2 Vaccination 03/15/2019, 04/09/2019  . Unspecified SARS-COV-2 Vaccination 03/08/2019, 03/29/2019    Past Medical History:  Diagnosis Date  . Hypertension   . SCC (squamous cell carcinoma) 08/02/2019   RIGHT FOREARM  (TX WITH  BX)  . SCC (squamous cell carcinoma) 08/02/2019   LEFT FOREARM  (TX WITH BX)  . Squamous cell carcinoma of skin 08/08/2017   in situ-left forearm (txpbx)  . Squamous cell carcinoma of skin 01/09/2019   in situ-left forearm (txpbx)  . Squamous cell carcinoma of skin 01/09/2019   in situ-right hand proximal (txpbx)  . Squamous cell carcinoma of skin 01/09/2019   in situ-right arm (txpbx)    Tobacco History: Social History   Tobacco Use  Smoking Status Never Smoker  Smokeless Tobacco Never Used   Counseling given: Not Answered    Outpatient Encounter Medications as of 09/24/2019  Medication Sig  . allopurinol (ZYLOPRIM) 100 MG tablet Take 100 mg by mouth 2 (two) times daily.  . carvedilol (COREG) 12.5 MG tablet Take 1 tablet in the AM and 1/2 tablet in the PM  . chlorthalidone (HYGROTON) 25 MG tablet TAKE 1/2 TABLET BY MOUTH DAILY  . colchicine 0.6 MG tablet Take 0.6 mg by mouth as needed. For gout flare  . diclofenac Sodium (VOLTAREN) 1 % GEL   . DICYCLOMINE HCL IM   . influenza vaccine adjuvanted (FLUAD QUADRIVALENT) 0.5 ML injection Fluad Quad 2020-2021(93yrup)(PF) 60 mcg (15 mcg x 4)/0.541mIM syringe  ADM 0.5ML IM UTD  . predniSONE (DELTASONE) 10 MG tablet Take 7.5 mg by mouth daily with breakfast.   . apixaban (ELIQUIS) 5 MG TABS tablet Take 1 tablet (5 mg total) by mouth 2 (two) times daily. (Patient not taking: Reported on 09/24/2019)   No facility-administered encounter medications on file as of 09/24/2019.     Review of Systems  Review of Systems  No chest pain on exertion, increase LE edema, no syncope or pre-syncope.  Comprehensive review of systems otherwise negative.  Physical Exam  BP 126/72 (BP Location: Left Arm, Cuff Size: Normal)   Pulse 76   Temp 97.6 F (36.4 C) (Oral)   Ht _0  (1.778 m)   Wt 209 lb 12.8 oz (95.2 kg)   SpO2 98%   BMI 30.10 kg/m   Wt Readings from Last 5 Encounters:  09/24/19 209 lb 12.8 oz (95.2 kg)  09/04/19 206 lb 8 oz  (93.7 kg)  08/01/19 205 lb (93 kg)  06/25/19 203 lb 1.6 oz (92.1 kg)  02/27/18 203 lb (92.1 kg)    BMI Readings from Last 5 Encounters:  09/24/19 30.10 kg/m  09/04/19 29.63 kg/m  08/01/19 29.41 kg/m  06/25/19 29.14 kg/m  02/27/18 29.13 kg/m     Physical Exam General: Well-appearing, sitting up in exam chair Eyes: EOMI, no icterus Neck: Supple, range of motion intact Respiratory: Clear to auscultation bilaterally, no crackles or wheezes Cardiovascular: Regular rate, irregularly irregular rhythm, no murmurs, 2+ doughy pitting edema to mid shin/knee bilaterally, left greater than right Abdomen: Distended, nontender, bowel sounds present MSK: No synovitis, no frank joint  effusions  Neuro: Ambulates with walker, no weakness Psych: Normal mood, full affect   Assessment & Plan:   This is a 86 year old male with past medical history of A. fib formally on Xarelto recently held with anemia, valvular dysfunction, small pericardial effusion, and blood abnormalities whom are seen in consultation at the request of Maurice Small, MD for evaluation of dyspnea on exertion.  Suspect dyspnea exertion is multifactorial.  Do think largest contributor is likely volume overload given his exam and CT scan with bilateral effusions with underlying known left atrial hypertension given dilated LA on prior echo.  Unclear if exacerbated by pericardial effusion or runs of A. fib with RVR (in A. fib but rate is 80-90 on exam).  Likely contributing factor of anemia given mild improvement in symptoms as hemoglobin is improved while holding Xarelto.  Worry about occult source of bleeding, GI versus into large abdominal cyst.  Lastly, given the size of the cyst occupying essentially the entire right abdomen, possible restrictive physiology due to this cyst.  Plan to obtain PFTs in the coming weeks.  Suspect cardiology evaluation and ongoing work-up for abdominal mass will be helpful.  Agree with holding Xarelto for  now.  Will determine next steps and follow-up from a pulmonary perspective based on PFTs.   Return if symptoms worsen or fail to improve.   Lanier Clam, MD 09/24/2019

## 2019-09-25 ENCOUNTER — Ambulatory Visit (INDEPENDENT_AMBULATORY_CARE_PROVIDER_SITE_OTHER): Payer: Medicare Other | Admitting: Cardiology

## 2019-09-25 ENCOUNTER — Encounter: Payer: Self-pay | Admitting: Cardiology

## 2019-09-25 VITALS — BP 160/74 | HR 67 | Ht 69.0 in | Wt 208.4 lb

## 2019-09-25 DIAGNOSIS — I10 Essential (primary) hypertension: Secondary | ICD-10-CM

## 2019-09-25 DIAGNOSIS — R600 Localized edema: Secondary | ICD-10-CM | POA: Diagnosis not present

## 2019-09-25 DIAGNOSIS — R0609 Other forms of dyspnea: Secondary | ICD-10-CM

## 2019-09-25 DIAGNOSIS — I482 Chronic atrial fibrillation, unspecified: Secondary | ICD-10-CM | POA: Diagnosis not present

## 2019-09-25 DIAGNOSIS — I3139 Other pericardial effusion (noninflammatory): Secondary | ICD-10-CM

## 2019-09-25 DIAGNOSIS — Z7189 Other specified counseling: Secondary | ICD-10-CM | POA: Diagnosis not present

## 2019-09-25 DIAGNOSIS — I313 Pericardial effusion (noninflammatory): Secondary | ICD-10-CM

## 2019-09-25 DIAGNOSIS — R0600 Dyspnea, unspecified: Secondary | ICD-10-CM | POA: Diagnosis not present

## 2019-09-25 NOTE — Patient Instructions (Addendum)
Medication Instructions:  Your Physician recommend you continue on your current medication as directed.    *If you need a refill on your cardiac medications before your next appointment, please call your pharmacy*   Lab Work: None ordered   Testing/Procedures: Your physician has requested that you have an echocardiogram. Echocardiography is a painless test that uses sound waves to create images of your heart. It provides your doctor with information about the size and shape of your heart and how well your heart's chambers and valves are working. This procedure takes approximately one hour. There are no restrictions for this procedure. [address]     Follow-Up: At [facility], you and your health needs are our priority.  As part of our continuing mission to provide you with exceptional heart care, we have created designated Provider Care Teams.  These Care Teams include your primary Cardiologist (physician) and Advanced Practice Providers (APPs -  Physician Assistants and Nurse Practitioners) who all work together to provide you with the care you need, when you need it.  We recommend signing up for the patient portal called "MyChart".  Sign up information is provided on this After Visit Summary.  MyChart is used to connect with patients for Virtual Visits (Telemedicine).  Patients are able to view lab/test results, encounter notes, upcoming appointments, etc.  Non-urgent messages can be sent to your provider as well.   To learn more about what you can do with MyChart, go to [email or web address].    Your next appointment:   4-6 week(s)  The format for your next appointment:   Virtual Visit   Provider:   Buford Dresser, MD

## 2019-09-25 NOTE — Progress Notes (Signed)
Cardiology Office Note:    Date:  09/25/2019   ID:  Joseph Leach, DOB [birth year], MRN [id number]  PCP:  Maurice Small, MD  Cardiologist:  Buford Dresser, MD  Referring MD: Maurice Small, MD   CC: new patient evaluation for pericardial effusion  History of Present Illness:    Joseph Leach is a 86 y.o. male with a hx of atrial fibrillation, diagnosed 2020, rheumatoid arthritis,  who is seen as a new consult at the request of Maurice Small, MD for the evaluation and management of pericardial effusion.  Only very brief communication noted from Dr. Jason Nest office, comment on new moderate pericardial effusion.  Patient here with son today. Saw Dr. Silas Flood yesterday with pulmonology. Has been following with Dr. Irene Limbo, not thought to be leukemia. Has large complex renal cyst, being followed by Dr. Alinda Money, has pending MRI for further evaluation.  No recent echocardiogram. Concern appears to stem from CT scan 08/05/19. Per read, "Heart is enlarged. Decreased attenuation of the intravascular compartment is indicative of anemia. Moderate pericardial effusion. Atherosclerotic calcification of the aorta, aortic valve and coronary arteries."  They were told they may need to have this drained.  Has been off apixaban for about a month. Asked if he needs a reduced dose. Based on labs, his Cr has been improving from 1.80 --> 1.51 most recently. He meets age criteria and is borderline for Cr (1.5), does not meet body weight dose reduction criteria. Last visit was with Roderic Palau 03/22/18 noted that Cr had elevated, recommended for reduced dose of apixaban at that time. Apixaban was stopped for bone marrow biopsy and has not yet been restarted. Planning to be followed every 2 mos with labs to monitor counts.   ROS positive for intermittent LE edema. Has nausea while lying in bed, improves with changing position.   Cardiovascular risk factors: Prior clinical ASCVD: none Comorbid conditions:  Endorses hypertension, hyperlipidemia (now off medication), chronic kidney disease. Denies diabetes. Metabolic syndrome/Obesity: BMI 30 Chronic inflammatory conditions: rheumatoid arthritis, gout Tobacco use history: never Exercise level: very limited, using rollator. About May/June, had worsening dyspnea on exertion, though this has improved slightly in the last month.  Checks BP at home 150-160/60-70. Most heart rates are in the 60s, rarely in the mid-50s.   Denies chest pain, shortness of breath at rest or with normal exertion. No PND, orthopnea, or unexpected weight gain. No syncope or palpitations.  Past Medical History:  Diagnosis Date  . Hypertension   . SCC (squamous cell carcinoma) 08/02/2019   RIGHT FOREARM  (TX WITH BX)  . SCC (squamous cell carcinoma) 08/02/2019   LEFT FOREARM  (TX WITH BX)  . Squamous cell carcinoma of skin 08/08/2017   in situ-left forearm (txpbx)  . Squamous cell carcinoma of skin 01/09/2019   in situ-left forearm (txpbx)  . Squamous cell carcinoma of skin 01/09/2019   in situ-right hand proximal (txpbx)  . Squamous cell carcinoma of skin 01/09/2019   in situ-right arm (txpbx)    Past Surgical History:  Procedure Laterality Date  . APPENDECTOMY    . LIGAMENT REPAIR Right   . PROSTATECTOMY    . TOTAL HIP ARTHROPLASTY      Current Medications: Current Outpatient Medications on File Prior to Visit  Medication Sig  . allopurinol (ZYLOPRIM) 100 MG tablet Take 100 mg by mouth 2 (two) times daily.  . carvedilol (COREG) 12.5 MG tablet Take 1 tablet in the AM and 1/2 tablet in the PM  . chlorthalidone (  HYGROTON) 25 MG tablet TAKE 1/2 TABLET BY MOUTH DAILY  . colchicine 0.6 MG tablet Take 0.6 mg by mouth as needed. For gout flare  . diclofenac Sodium (VOLTAREN) 1 % GEL   . DICYCLOMINE HCL IM   . influenza vaccine adjuvanted (FLUAD QUADRIVALENT) 0.5 ML injection Fluad Quad 2020-2021(30yrup)(PF) 60 mcg (15 mcg x 4)/0.574mIM syringe  ADM 0.5ML IM UTD    . predniSONE (DELTASONE) 10 MG tablet Take 7.5 mg by mouth daily with breakfast.   . apixaban (ELIQUIS) 5 MG TABS tablet Take 1 tablet (5 mg total) by mouth 2 (two) times daily. (Patient not taking: Reported on 09/25/2019)   No current facility-administered medications on file prior to visit.     Allergies:   Patient has no known allergies.   Social History   Tobacco Use  . Smoking status: Never Smoker  . Smokeless tobacco: Never Used  Vaping Use  . Vaping Use: Never used  Substance Use Topics  . Alcohol use: Not Currently    Comment: occasional  . Drug use: Never    Family History: Denies family history of early cardiovascular disease  ROS:   Please see the history of present illness.  Additional pertinent ROS: Constitutional: Negative for chills, fever, night sweats, unintentional weight loss  HENT: Negative for ear pain and hearing loss.   Eyes: Negative for loss of vision and eye pain.  Respiratory: Negative for cough, sputum, wheezing.   Cardiovascular: See HPI. Gastrointestinal: Negative for abdominal pain, melena, and hematochezia.  Genitourinary: Negative for dysuria and hematuria.  Musculoskeletal: Negative for falls and myalgias.  Skin: Negative for itching and rash.  Neurological: Negative for focal weakness, focal sensory changes and loss of consciousness.  Endo/Heme/Allergies: Does bruise/bleed easily.     EKGs/Labs/Other Studies Reviewed:    The following studies were reviewed today: Echo 02/09/18 Left ventricle: The cavity size was normal. Wall thickness was  increased in a pattern of moderate LVH. Systolic function was  normal. The estimated ejection fraction was in the range of 55%  to 60%. Doppler parameters are consistent with abnormal left  ventricular relaxation (grade 1 diastolic dysfunction).  - Aortic valve: Sclerosis without stenosis. Valve area (VTI): 1.5  cm^2. Valve area (Vmax): 1.74 cm^2. Valve area (Vmean): 1.71  cm^2.  -  Mitral valve: Calcified annulus.  - Left atrium: The atrium was severely dilated.  - Atrial septum: No defect or patent foramen ovale was identified.  - Pericardium, extracardiac: Small pericardial effusion mostly over  the RV no tamponade.   EKG:  EKG is personally reviewed.  The ekg ordered today demonstrates atrial fibrillation, RBBB, HR 67  Recent Labs: 09/04/2019: ALT 11; BUN 32; Creatinine 1.51; Hemoglobin 10.5; Platelet Count 179; Potassium 4.4; Sodium 142  Recent Lipid Panel No results found for: CHOL, TRIG, HDL, CHOLHDL, VLDL, LDLCALC, LDLDIRECT  Physical Exam:    VS:  BP (!) 160/74   Pulse 67   Ht 5' 9"  (1.753 m)   Wt 208 lb 6.4 oz (94.5 kg)   SpO2 99%   BMI 30.78 kg/m     Wt Readings from Last 3 Encounters:  09/25/19 208 lb 6.4 oz (94.5 kg)  09/24/19 209 lb 12.8 oz (95.2 kg)  09/04/19 206 lb 8 oz (93.7 kg)    GEN: Well nourished, well developed in no acute distress HEENT: Normal, moist mucous membranes NECK: No JVD CARDIAC: regular rhythm, normal S1 and S2, no rubs or gallops. No murmurs. VASCULAR: Radial and DP pulses 2+  bilaterally. No carotid bruits RESPIRATORY:  Clear to auscultation without rales, wheezing or rhonchi  ABDOMEN: Soft, non-tender, non-distended MUSCULOSKELETAL:  Ambulates independently with rollator SKIN: Warm and dry, bilateral LE edema, pitting, 2+, to upper calves bilaterally NEUROLOGIC:  Alert and oriented x 3. No focal neuro deficits noted. PSYCHIATRIC:  Normal affect    ASSESSMENT:    1. Pericardial effusion   2. Chronic atrial fibrillation (HCC)   3. Bilateral leg edema   4. Dyspnea on exertion   5. Essential hypertension   6. Counseling on health promotion and disease prevention    PLAN:    Pericardial effusion seen on CT: -echocardiogram -given lack of symptoms, elevated BP, no JVD, no clinical evidence of tamponade -discussed that we only perform pericardiocentesis for hemodynamically unstable patients 2/2 tamponade or  where diagnosis requires fluid. As he is currently being evaluated for both MDS and complex renal cyst, do not think that pericardiocentesis required at this time  Atrial fibrillation, chronic: -reports he is asymptomatic in afib -has been off of apixaban for about a month. Discussed proper dosing. Right on the border given renal function. Due for follow up labs in several weeks -CHA2DS2/VAS Stroke Risk Points=3 -has MRI pending to further evaluate renal mass. Unclear if invasive procedure will be needed -hold apixaban until sure no plans for invasive procedure. Restart based on Cr from upcoming labs. If Cr <1.5, will need 5 mg BID dosing, whereas he will need 2.5 mg BID if Cr >1.5  Bilateral LE edema, dyspnea on exertion: -no JVD on exam, lungs clear -echocardiogram for further evaluation  Plan for follow up: 4-6 weeks  Buford Dresser, MD, PhD [place]  [facility]    Medication Adjustments/Labs and Tests Ordered: Current medicines are reviewed at length with the patient today.  Concerns regarding medicines are outlined above.  Orders Placed This Encounter  Procedures  . EKG 12-Lead  . ECHOCARDIOGRAM COMPLETE   No orders of the defined types were placed in this encounter.   Patient Instructions  Medication Instructions:  Your Physician recommend you continue on your current medication as directed.    *If you need a refill on your cardiac medications before your next appointment, please call your pharmacy*   Lab Work: None ordered   Testing/Procedures: Your physician has requested that you have an echocardiogram. Echocardiography is a painless test that uses sound waves to create images of your heart. It provides your doctor with information about the size and shape of your heart and how well your heart's chambers and valves are working. This procedure takes approximately one hour. There are no restrictions for this procedure. [place]  [address]     Follow-Up: At [facility], you and your health needs are our priority.  As part of our continuing mission to provide you with exceptional heart care, we have created designated Provider Care Teams.  These Care Teams include your primary Cardiologist (physician) and Advanced Practice Providers (APPs -  Physician Assistants and Nurse Practitioners) who all work together to provide you with the care you need, when you need it.  We recommend signing up for the patient portal called "MyChart".  Sign up information is provided on this After Visit Summary.  MyChart is used to connect with patients for Virtual Visits (Telemedicine).  Patients are able to view lab/test results, encounter notes, upcoming appointments, etc.  Non-urgent messages can be sent to your provider as well.   To learn more about what you can do  with MyChart, go to [email or web address].    Your next appointment:   4-6 week(s)  The format for your next appointment:   Virtual Visit   Provider:   Buford Dresser, MD       Signed, Buford Dresser, MD PhD 09/25/2019 6:44 PM    [place]

## 2019-09-30 ENCOUNTER — Ambulatory Visit
Admission: RE | Admit: 2019-09-30 | Discharge: 2019-09-30 | Disposition: A | Discharge status: Home / Self Care | Payer: Medicare Other | Source: Ambulatory Visit | Attending: Urology | Admitting: Urology

## 2019-09-30 ENCOUNTER — Other Ambulatory Visit: Payer: Self-pay

## 2019-09-30 DIAGNOSIS — I700 Atherosclerosis of aorta: Secondary | ICD-10-CM | POA: Diagnosis not present

## 2019-09-30 DIAGNOSIS — N2889 Other specified disorders of kidney and ureter: Secondary | ICD-10-CM | POA: Diagnosis not present

## 2019-09-30 DIAGNOSIS — N281 Cyst of kidney, acquired: Secondary | ICD-10-CM | POA: Diagnosis not present

## 2019-09-30 DIAGNOSIS — D4101 Neoplasm of uncertain behavior of right kidney: Secondary | ICD-10-CM

## 2019-09-30 DIAGNOSIS — C7951 Secondary malignant neoplasm of bone: Secondary | ICD-10-CM | POA: Diagnosis not present

## 2019-09-30 MED ORDER — GADOBENATE DIMEGLUMINE 529 MG/ML IV SOLN
18.0000 mL | Freq: Once | INTRAVENOUS | Status: AC | PRN
  Administered 2019-09-30: 18 mL via INTRAVENOUS

## 2019-10-01 ENCOUNTER — Other Ambulatory Visit: Payer: Self-pay | Admitting: Student

## 2019-10-03 ENCOUNTER — Other Ambulatory Visit: Payer: Self-pay

## 2019-10-03 MED ORDER — CHLORTHALIDONE 25 MG PO TABS
12.5000 mg | ORAL_TABLET | Freq: Every day | ORAL | 3 refills | Status: DC
Start: 2019-10-03 — End: 2020-01-31

## 2019-10-03 NOTE — Telephone Encounter (Signed)
Called and spoke w/pt and they stated that they were told to hold their coumadin for now

## 2019-10-04 ENCOUNTER — Other Ambulatory Visit: Payer: Self-pay | Admitting: *Deleted

## 2019-10-07 ENCOUNTER — Telehealth: Payer: Self-pay | Admitting: Cardiology

## 2019-10-07 ENCOUNTER — Other Ambulatory Visit: Payer: Self-pay

## 2019-10-07 ENCOUNTER — Encounter: Payer: Self-pay | Admitting: Cardiology

## 2019-10-07 ENCOUNTER — Ambulatory Visit (HOSPITAL_COMMUNITY): Payer: Medicare Other | Attending: Cardiology

## 2019-10-07 DIAGNOSIS — R0600 Dyspnea, unspecified: Secondary | ICD-10-CM | POA: Insufficient documentation

## 2019-10-07 DIAGNOSIS — I313 Pericardial effusion (noninflammatory): Secondary | ICD-10-CM | POA: Insufficient documentation

## 2019-10-07 DIAGNOSIS — I3139 Other pericardial effusion (noninflammatory): Secondary | ICD-10-CM

## 2019-10-07 DIAGNOSIS — R0609 Other forms of dyspnea: Secondary | ICD-10-CM

## 2019-10-07 LAB — ECHOCARDIOGRAM COMPLETE
P 1/2 time: 518 msec
S' Lateral: 2 cm

## 2019-10-07 NOTE — Progress Notes (Unsigned)
Patient ID: Joseph Leach, male   DOB: Oct 30, 1925, 86 y.o.   MRN: [id number]  Echo ordered for pericardial effusion. Consulted (DOD) - Dr. Harrington Challenger prior to discharging patient. Per Dr. Harrington Challenger, ok to discharge patient.

## 2019-10-07 NOTE — Telephone Encounter (Signed)
Encounter not needed

## 2019-10-08 ENCOUNTER — Other Ambulatory Visit: Payer: Self-pay

## 2019-10-08 ENCOUNTER — Ambulatory Visit (INDEPENDENT_AMBULATORY_CARE_PROVIDER_SITE_OTHER): Payer: Medicare Other | Admitting: Physician Assistant

## 2019-10-08 ENCOUNTER — Encounter: Payer: Self-pay | Admitting: Physician Assistant

## 2019-10-08 DIAGNOSIS — Q825 Congenital non-neoplastic nevus: Secondary | ICD-10-CM | POA: Diagnosis not present

## 2019-10-08 DIAGNOSIS — L570 Actinic keratosis: Secondary | ICD-10-CM

## 2019-10-08 DIAGNOSIS — Z1283 Encounter for screening for malignant neoplasm of skin: Secondary | ICD-10-CM

## 2019-10-08 NOTE — Patient Instructions (Signed)

## 2019-10-09 NOTE — Progress Notes (Addendum)
   Follow-Up Visit   Subjective  Joseph Leach is a 86 y.o. male who presents for the following: Skin Problem (right nostril junction- no itch no bleed,right cheek- growth, came off yesterday).   The following portions of the chart were reviewed this encounter and updated as appropriate: Tobacco  Allergies  Meds  Problems  Med Hx  Surg Hx  Fam Hx      Objective  Well appearing patient in no apparent distress; mood and affect are within normal limits.  A focused examination was performed including face, neck, scalp and arms. Relevant physical exam findings are noted in the Assessment and Plan.  Objective  face and arms: All scars clear  Objective  Right Buccal Cheek : Tan-brown symmetric papule  Objective    Right Parotid Area: Hyperkeratotic scale with pink base  Txpbx- curet & cautery 1.6cm  Images    Assessment & Plan  Screening exam for skin cancer face and arms  observe  Congenital non-neoplastic nevus Right Buccal Cheek   observe  Actinic keratosis Right Parotid Area  Skin / nail biopsy - Right Parotid Area Type of biopsy: tangential   Informed consent: discussed and consent obtained   Timeout: patient name, date of birth, surgical site, and procedure verified   Anesthesia: the lesion was anesthetized in a standard fashion   Anesthetic:  1% lidocaine w/ epinephrine 1-100,000 local infiltration Instrument used: flexible razor blade   Hemostasis achieved with: aluminum chloride and electrodesiccation   Outcome: patient tolerated procedure well   Post-procedure details: wound care instructions given    Destruction of lesion - Right Parotid Area Complexity: simple   Destruction method: electrodesiccation and curettage   Informed consent: discussed and consent obtained   Timeout:  patient name, date of birth, surgical site, and procedure verified Anesthesia: the lesion was anesthetized in a standard fashion   Anesthetic:  1% lidocaine w/  epinephrine 1-100,000 local infiltration Curettage performed in three different directions: Yes   Electrodesiccation performed over the curetted area: Yes   Curettage cycles:  3 Margin per side (cm):  0.1 Final wound size (cm):  1.6 Hemostasis achieved with:  aluminum chloride Outcome: patient tolerated procedure well with no complications   Post-procedure details: wound care instructions given    Specimen 1 - Surgical pathology Differential Diagnosis: scc Check Margins: yes   I, Jerson Furukawa, PA-C, have reviewed all documentation's for this visit.  The documentation on 12/06/19 for the exam, diagnosis, procedures and orders are all accurate and complete.

## 2019-10-10 ENCOUNTER — Other Ambulatory Visit: Payer: Self-pay

## 2019-10-10 NOTE — Telephone Encounter (Signed)
26m 94.5kg Scr 1.51 09/04/19 Lovw/christopher 09/25/19 Pt requesting 5mg  eliquis but qualifies for 2.5 based on age and scr. Will sen to pharmd pool to verify correct dosage

## 2019-10-11 MED ORDER — APIXABAN 5 MG PO TABS
5.0000 mg | ORAL_TABLET | Freq: Two times a day (BID) | ORAL | 0 refills | Status: DC
Start: 2019-10-11 — End: 2019-11-01

## 2019-10-11 NOTE — Telephone Encounter (Signed)
SCr right on cutoff for dose change. Pt has labs pending in 3 weeks, will continue on same dose and wait for recheck before changing dose.

## 2019-10-18 DIAGNOSIS — M170 Bilateral primary osteoarthritis of knee: Secondary | ICD-10-CM | POA: Diagnosis not present

## 2019-10-22 DIAGNOSIS — Z8546 Personal history of malignant neoplasm of prostate: Secondary | ICD-10-CM | POA: Diagnosis not present

## 2019-10-22 DIAGNOSIS — D49511 Neoplasm of unspecified behavior of right kidney: Secondary | ICD-10-CM | POA: Diagnosis not present

## 2019-10-24 ENCOUNTER — Ambulatory Visit (INDEPENDENT_AMBULATORY_CARE_PROVIDER_SITE_OTHER): Payer: Medicare Other | Admitting: Pulmonary Disease

## 2019-10-24 ENCOUNTER — Other Ambulatory Visit: Payer: Self-pay

## 2019-10-24 DIAGNOSIS — R0600 Dyspnea, unspecified: Secondary | ICD-10-CM | POA: Diagnosis not present

## 2019-10-24 DIAGNOSIS — R0609 Other forms of dyspnea: Secondary | ICD-10-CM

## 2019-10-24 LAB — PULMONARY FUNCTION TEST
DL/VA: 4.26 ml/min/mmHg/L
DLCO cor: 17.74 ml/min/mmHg
DLCO unc: 17.74 ml/min/mmHg
FEF 25-75 Post: 1.88 L/sec
FEF 25-75 Pre: 1.05 L/sec
FEF2575-%Change-Post: 79 %
FEF2575-%Pred-Post: 146 %
FEF2575-%Pred-Pre: 81 %
FEV1-%Change-Post: 15 %
FEV1-%Pred-Post: 71 %
FEV1-%Pred-Pre: 61 %
FEV1-Post: 1.59 L
FEV1-Pre: 1.38 L
FEV1FVC-%Change-Post: 1 %
FEV1FVC-%Pred-Pre: 108 %
FEV6-%Change-Post: 14 %
FEV6-%Pred-Post: 68 %
FEV6-%Pred-Pre: 60 %
FEV6-Post: 2.09 L
FEV6-Pre: 1.82 L
FEV6FVC-%Change-Post: 0 %
FEV6FVC-%Pred-Post: 109 %
FEV6FVC-%Pred-Pre: 108 %
FVC-%Change-Post: 13 %
FVC-%Pred-Post: 63 %
FVC-%Pred-Pre: 55 %
FVC-Post: 2.09 L
FVC-Pre: 1.84 L
Post FEV1/FVC ratio: 76 %
Post FEV6/FVC ratio: 100 %
Pre FEV1/FVC ratio: 75 %
Pre FEV6/FVC Ratio: 99 %
RV % pred: 102 %
RV: 2.92 L
TLC % pred: 64 %
TLC: 4.47 L

## 2019-10-24 NOTE — Progress Notes (Signed)
Full PFT performed today. °

## 2019-10-28 ENCOUNTER — Telehealth: Payer: Medicare Other | Admitting: Cardiology

## 2019-10-28 DIAGNOSIS — Z23 Encounter for immunization: Secondary | ICD-10-CM | POA: Diagnosis not present

## 2019-11-01 ENCOUNTER — Telehealth (INDEPENDENT_AMBULATORY_CARE_PROVIDER_SITE_OTHER): Payer: Medicare Other | Admitting: Cardiology

## 2019-11-01 VITALS — Ht 69.0 in | Wt 202.0 lb

## 2019-11-01 DIAGNOSIS — R600 Localized edema: Secondary | ICD-10-CM | POA: Diagnosis not present

## 2019-11-01 DIAGNOSIS — I313 Pericardial effusion (noninflammatory): Secondary | ICD-10-CM | POA: Diagnosis not present

## 2019-11-01 DIAGNOSIS — R0600 Dyspnea, unspecified: Secondary | ICD-10-CM

## 2019-11-01 DIAGNOSIS — I482 Chronic atrial fibrillation, unspecified: Secondary | ICD-10-CM | POA: Diagnosis not present

## 2019-11-01 DIAGNOSIS — I3139 Other pericardial effusion (noninflammatory): Secondary | ICD-10-CM

## 2019-11-01 DIAGNOSIS — Z712 Person consulting for explanation of examination or test findings: Secondary | ICD-10-CM

## 2019-11-01 DIAGNOSIS — R0609 Other forms of dyspnea: Secondary | ICD-10-CM

## 2019-11-01 NOTE — Progress Notes (Signed)
Virtual Visit via Telephone Note   This visit type was conducted due to national recommendations for restrictions regarding the COVID-19 Pandemic (e.g. social distancing) in an effort to limit this patient's exposure and mitigate transmission in our community.  Due to his co-morbid illnesses, this patient is at least at moderate risk for complications without adequate follow up.  This format is felt to be most appropriate for this patient at this time.  The patient did not have access to video technology/had technical difficulties with video requiring transitioning to audio format only (telephone).  All issues noted in this document were discussed and addressed.  No physical exam could be performed with this format.  Please refer to the patient's chart for his  consent to telehealth for Joseph Leach.   The patient was identified using 2 identifiers.  Patient Location: Home Provider Location: Home Office  Date:  11/01/2019   ID:  Joseph Leach, DOB November 18, 1925, MRN [id number]  PCP:  Maurice Small, MD  Cardiologist:  Buford Dresser, MD  Referring MD: Maurice Small, MD   CC: follow up  History of Present Illness:    Joseph Leach is a 86 y.o. male with a hx of atrial fibrillation, diagnosed 2020, rheumatoid arthritis,  who is seen as a for follow up. I initially met him 09/25/19 as a new consult at the request of Maurice Small, MD for the evaluation and management of pericardial effusion.  CV history: CT scan 08/05/19. Per read, "Heart is enlarged. Decreased attenuation of the intravascular compartment is indicative of anemia. Moderate pericardial effusion. Atherosclerotic calcification of the aorta, aortic valve and coronary arteries."  They were told they may need to have this drained.  Today: Compression stockings helping with itching in his legs, reduced swelling. Still has fatigue at the end of the day. Breathing is short, especially with walking any distance. Blood pressure  checked about once a week, 120-150s/70-80s, HR 60-70s since July. No dizziness/lightheadedness, changes position slowly.  Reviewed echo results. We discussed other things that can cause dyspnea on exertion. Discussed stress testing and cath at length.  Also discussed apixaban. Has been off of this while he has gotten testing. Told by urologist that cyst is not malignant and does not need to be treated. Had PFTs last week; has previously been told lungs were good.   Past Medical History:  Diagnosis Date  . Hypertension   . SCC (squamous cell carcinoma) 08/02/2019   RIGHT FOREARM  (TX WITH BX)  . SCC (squamous cell carcinoma) 08/02/2019   LEFT FOREARM  (TX WITH BX)  . Squamous cell carcinoma of skin 08/08/2017   in situ-left forearm (txpbx)  . Squamous cell carcinoma of skin 01/09/2019   in situ-left forearm (txpbx)  . Squamous cell carcinoma of skin 01/09/2019   in situ-right hand proximal (txpbx)  . Squamous cell carcinoma of skin 01/09/2019   in situ-right arm (txpbx)    Past Surgical History:  Procedure Laterality Date  . APPENDECTOMY    . LIGAMENT REPAIR Right   . PROSTATECTOMY    . TOTAL HIP ARTHROPLASTY      Current Medications: Current Outpatient Medications on File Prior to Visit  Medication Sig  . allopurinol (ZYLOPRIM) 100 MG tablet Take 100 mg by mouth 2 (two) times daily.  . carvedilol (COREG) 12.5 MG tablet Take 1 tablet in the AM and 1/2 tablet in the PM  . chlorthalidone (HYGROTON) 25 MG tablet Take 0.5 tablets (12.5 mg total) by mouth daily.  Marland Kitchen  colchicine 0.6 MG tablet Take 0.6 mg by mouth as needed. For gout flare  . diclofenac Sodium (VOLTAREN) 1 % GEL daily as needed.   Marland Kitchen DICYCLOMINE HCL IM 20 mg daily. 4 tablets daily  . predniSONE (DELTASONE) 10 MG tablet Take 7.5 mg by mouth 2 (two) times daily with a meal.    No current facility-administered medications on file prior to visit.     Allergies:   Patient has no known allergies.   Social History    Tobacco Use  . Smoking status: Never Smoker  . Smokeless tobacco: Never Used  Vaping Use  . Vaping Use: Never used  Substance Use Topics  . Alcohol use: Not Currently    Comment: occasional  . Drug use: Never    Family History: Denies family history of early cardiovascular disease  ROS:   Please see the history of present illness.  Additional pertinent ROS otherwise unremarkable   EKGs/Labs/Other Studies Reviewed:    The following studies were reviewed today: Echo 10/07/19   Echo 02/09/18 Left ventricle: The cavity size was normal. Wall thickness was  increased in a pattern of moderate LVH. Systolic function was  normal. The estimated ejection fraction was in the range of 55%  to 60%. Doppler parameters are consistent with abnormal left  ventricular relaxation (grade 1 diastolic dysfunction).  - Aortic valve: Sclerosis without stenosis. Valve area (VTI): 1.5  cm^2. Valve area (Vmax): 1.74 cm^2. Valve area (Vmean): 1.71  cm^2.  - Mitral valve: Calcified annulus.  - Left atrium: The atrium was severely dilated.  - Atrial septum: No defect or patent foramen ovale was identified.  - Pericardium, extracardiac: Small pericardial effusion mostly over  the RV no tamponade.   EKG:  EKG is personally reviewed.  The ekg ordered 09/25/19 demonstrates atrial fibrillation, RBBB, HR 67  Recent Labs: 09/04/2019: ALT 11; BUN 32; Creatinine 1.51; Hemoglobin 10.5; Platelet Count 179; Potassium 4.4; Sodium 142  Recent Lipid Panel No results found for: CHOL, TRIG, HDL, CHOLHDL, VLDL, LDLCALC, LDLDIRECT  Physical Exam:    VS:  Ht _0  (1.753 m)   Wt 202 lb (91.6 kg)   BMI 29.83 kg/m     Wt Readings from Last 3 Encounters:  11/01/19 202 lb (91.6 kg)  09/25/19 208 lb 6.4 oz (94.5 kg)  09/24/19 209 lb 12.8 oz (95.2 kg)    Speaking comfortably on the phone, no audible wheezing In no acute distress Alert and oriented Normal affect Normal speech  ASSESSMENT:    1.  Dyspnea on exertion   2. Pericardial effusion   3. Chronic atrial fibrillation ([place])   4. Bilateral leg edema    PLAN:    Pericardial effusion seen on CT: -echocardiogram without evidence of tamponade  Atrial fibrillation, chronic: -reports he is asymptomatic in afib -CHA2DS2/VAS Stroke Risk Points=3 -we discussed apixaban today. No plans for intervention on renal mass. They would like to do stress test, and if no cath needed then would restart apixaban. If Cr <1.5, will need 5 mg BID dosing, whereas he will need 2.5 mg BID if Cr >1.5  Bilateral LE edema, dyspnea on exertion: -echo unremarkable, reviewed test results today -LE edema improved with compression stocking -discussed DOE as anginal equivalent. After extensive shared decision making, they wish to proceed with lexiscan myoview stress test  Plan for follow up: 3 mos or sooner based on results of testing  Today, I have spent 33 minutes with the patient with telehealth technology discussing the above problems.  Additional time spent in chart review, documentation, and communication.  Medication Adjustments/Labs and Tests Ordered: Current medicines are reviewed at length with the patient today.  Concerns regarding medicines are outlined above.  Orders Placed This Encounter  Procedures  . MYOCARDIAL PERFUSION IMAGING   No orders of the defined types were placed in this encounter.   Patient Instructions  Medication Instructions:  Your Physician recommend you continue on your current medication as directed.    *If you need a refill on your cardiac medications before your next appointment, please call your pharmacy*   Lab Work: None ordered    Testing/Procedures: Your physician has requested that you have a lexiscan myoview. For further information please visit [email or web address]. Please follow instruction sheet, as given. [place]. [address]    Follow-Up: At [facility], you and your health needs  are our priority.  As part of our continuing mission to provide you with exceptional heart care, we have created designated Provider Care Teams.  These Care Teams include your primary Cardiologist (physician) and Advanced Practice Providers (APPs -  Physician Assistants and Nurse Practitioners) who all work together to provide you with the care you need, when you need it.  We recommend signing up for the patient portal called "MyChart".  Sign up information is provided on this After Visit Summary.  MyChart is used to connect with patients for Virtual Visits (Telemedicine).  Patients are able to view lab/test results, encounter notes, upcoming appointments, etc.  Non-urgent messages can be sent to your provider as well.   To learn more about what you can do with MyChart, go to [email or web address].    Your next appointment:   3 month(s)  The format for your next appointment:   In Person  Provider:   Buford Dresser, MD  You are scheduled for a Myocardial Perfusion Imaging Study.  Please arrive 15 minutes prior to your appointment time for registration and insurance purposes.  The test will take approximately 3 to 4 hours to complete; you may bring reading material.  If someone comes with you to your appointment, they will need to remain in the main lobby due to limited space in the testing area. **If you are pregnant or breastfeeding, please notify the nuclear lab prior to your appointment**  How to prepare for your Myocardial Perfusion Test: . Do not eat or drink 3 hours prior to your test, except you may have water. . Do not consume products containing caffeine (regular or decaffeinated) 12 hours prior to your test. (ex: coffee, chocolate, sodas, tea). . Do bring a list of your current medications with you.  If not listed below, you may take your medications as normal. . Do wear comfortable clothes (no dresses or overalls) and walking shoes, tennis shoes preferred (No heels or open  toe shoes are allowed). . Do NOT wear cologne, perfume, aftershave, or lotions (deodorant is allowed). . If these instructions are not followed, your test will have to be rescheduled.  Please report to [place], [address] for your test.  If you have questions or concerns about your appointment, you can call the Nuclear Lab at [phone].  If you cannot keep your appointment, please provide 24 hours notification to the Nuclear Lab, to avoid a possible $50 charge to your account.    Signed, Buford Dresser, MD PhD 11/01/2019  [place]

## 2019-11-01 NOTE — Patient Instructions (Signed)
Medication Instructions:  Your Physician recommend you continue on your current medication as directed.    *If you need a refill on your cardiac medications before your next appointment, please call your pharmacy*   Lab Work: None ordered    Testing/Procedures: Your physician has requested that you have a lexiscan myoview. For further information please visit [email or web address]. Please follow instruction sheet, as given. [place]. [address]    Follow-Up: At [facility], you and your health needs are our priority.  As part of our continuing mission to provide you with exceptional heart care, we have created designated Provider Care Teams.  These Care Teams include your primary Cardiologist (physician) and Advanced Practice Providers (APPs -  Physician Assistants and Nurse Practitioners) who all work together to provide you with the care you need, when you need it.  We recommend signing up for the patient portal called "MyChart".  Sign up information is provided on this After Visit Summary.  MyChart is used to connect with patients for Virtual Visits (Telemedicine).  Patients are able to view lab/test results, encounter notes, upcoming appointments, etc.  Non-urgent messages can be sent to your provider as well.   To learn more about what you can do with MyChart, go to [email or web address].    Your next appointment:   3 month(s)  The format for your next appointment:   In Person  Provider:   Buford Dresser, MD  You are scheduled for a Myocardial Perfusion Imaging Study.  Please arrive 15 minutes prior to your appointment time for registration and insurance purposes.  The test will take approximately 3 to 4 hours to complete; you may bring reading material.  If someone comes with you to your appointment, they will need to remain in the main lobby due to limited space in the testing area. **If you are pregnant or breastfeeding, please notify the nuclear lab prior  to your appointment**  How to prepare for your Myocardial Perfusion Test: . Do not eat or drink 3 hours prior to your test, except you may have water. . Do not consume products containing caffeine (regular or decaffeinated) 12 hours prior to your test. (ex: coffee, chocolate, sodas, tea). . Do bring a list of your current medications with you.  If not listed below, you may take your medications as normal. . Do wear comfortable clothes (no dresses or overalls) and walking shoes, tennis shoes preferred (No heels or open toe shoes are allowed). . Do NOT wear cologne, perfume, aftershave, or lotions (deodorant is allowed). . If these instructions are not followed, your test will have to be rescheduled.  Please report to [place], [address] for your test.  If you have questions or concerns about your appointment, you can call the Nuclear Lab at [phone].  If you cannot keep your appointment, please provide 24 hours notification to the Nuclear Lab, to avoid a possible $50 charge to your account.

## 2019-11-03 ENCOUNTER — Encounter: Payer: Self-pay | Admitting: Cardiology

## 2019-11-05 ENCOUNTER — Inpatient Hospital Stay (HOSPITAL_BASED_OUTPATIENT_CLINIC_OR_DEPARTMENT_OTHER): Payer: Medicare Other | Admitting: Hematology

## 2019-11-05 ENCOUNTER — Inpatient Hospital Stay: Payer: Medicare Other | Attending: Hematology

## 2019-11-05 ENCOUNTER — Other Ambulatory Visit: Payer: Self-pay

## 2019-11-05 VITALS — BP 182/90 | HR 74 | Temp 97.6°F | Resp 18 | Ht 69.0 in | Wt 202.4 lb

## 2019-11-05 DIAGNOSIS — N183 Chronic kidney disease, stage 3 unspecified: Secondary | ICD-10-CM | POA: Insufficient documentation

## 2019-11-05 DIAGNOSIS — D509 Iron deficiency anemia, unspecified: Secondary | ICD-10-CM

## 2019-11-05 DIAGNOSIS — Z23 Encounter for immunization: Secondary | ICD-10-CM | POA: Diagnosis not present

## 2019-11-05 DIAGNOSIS — D7589 Other specified diseases of blood and blood-forming organs: Secondary | ICD-10-CM | POA: Diagnosis not present

## 2019-11-05 DIAGNOSIS — M899 Disorder of bone, unspecified: Secondary | ICD-10-CM | POA: Diagnosis not present

## 2019-11-05 DIAGNOSIS — C946 Myelodysplastic disease, not classified: Secondary | ICD-10-CM

## 2019-11-05 DIAGNOSIS — D469 Myelodysplastic syndrome, unspecified: Secondary | ICD-10-CM | POA: Diagnosis not present

## 2019-11-05 LAB — CBC WITH DIFFERENTIAL/PLATELET
Abs Immature Granulocytes: 0.41 10*3/uL — ABNORMAL HIGH (ref 0.00–0.07)
Basophils Absolute: 0 10*3/uL (ref 0.0–0.1)
Basophils Relative: 1 %
Eosinophils Absolute: 0 10*3/uL (ref 0.0–0.5)
Eosinophils Relative: 1 %
HCT: 34.6 % — ABNORMAL LOW (ref 39.0–52.0)
Hemoglobin: 10.4 g/dL — ABNORMAL LOW (ref 13.0–17.0)
Immature Granulocytes: 5 %
Lymphocytes Relative: 17 %
Lymphs Abs: 1.5 10*3/uL (ref 0.7–4.0)
MCH: 28.2 pg (ref 26.0–34.0)
MCHC: 30.1 g/dL (ref 30.0–36.0)
MCV: 93.8 fL (ref 80.0–100.0)
Monocytes Absolute: 0.7 10*3/uL (ref 0.1–1.0)
Monocytes Relative: 8 %
Neutro Abs: 6.1 10*3/uL (ref 1.7–7.7)
Neutrophils Relative %: 68 %
Platelets: 158 10*3/uL (ref 150–400)
RBC: 3.69 MIL/uL — ABNORMAL LOW (ref 4.22–5.81)
RDW: 17.9 % — ABNORMAL HIGH (ref 11.5–15.5)
WBC: 8.8 10*3/uL (ref 4.0–10.5)
nRBC: 0 % (ref 0.0–0.2)

## 2019-11-05 LAB — CMP (CANCER CENTER ONLY)
ALT: 14 U/L (ref 0–44)
AST: 14 U/L — ABNORMAL LOW (ref 15–41)
Albumin: 3.4 g/dL — ABNORMAL LOW (ref 3.5–5.0)
Alkaline Phosphatase: 75 U/L (ref 38–126)
Anion gap: 6 (ref 5–15)
BUN: 39 mg/dL — ABNORMAL HIGH (ref 8–23)
CO2: 26 mmol/L (ref 22–32)
Calcium: 10.2 mg/dL (ref 8.9–10.3)
Chloride: 109 mmol/L (ref 98–111)
Creatinine: 1.88 mg/dL — ABNORMAL HIGH (ref 0.61–1.24)
GFR, Estimated: 30 mL/min — ABNORMAL LOW (ref >60)
Glucose, Bld: 96 mg/dL (ref 70–99)
Potassium: 4.5 mmol/L (ref 3.5–5.1)
Sodium: 141 mmol/L (ref 135–145)
Total Bilirubin: 0.6 mg/dL (ref 0.3–1.2)
Total Protein: 6.1 g/dL — ABNORMAL LOW (ref 6.5–8.1)

## 2019-11-05 LAB — IRON AND TIBC
Iron: 42 ug/dL (ref 42–163)
Saturation Ratios: 16 % — ABNORMAL LOW (ref 20–55)
TIBC: 268 ug/dL (ref 202–409)
UIBC: 225 ug/dL (ref 117–376)

## 2019-11-05 LAB — SAMPLE TO BLOOD BANK

## 2019-11-05 LAB — FERRITIN: Ferritin: 142 ng/mL (ref 24–336)

## 2019-11-05 NOTE — Patient Instructions (Signed)
Thank you for choosing [facility] to provide your oncology and hematology care.   Should you have questions after your visit to the [facility] ([facility]), please contact this office at [phone] between 8:30 AM and 4:30 PM.  Voice mails left after 4:00 PM may not be returned until the following business day.  Calls received after 4:30 PM will be answered by an off-site Nurse Triage Line.    Prescription Refills:  Please have your pharmacy contact us directly for most prescription requests.  Contact the office directly for refills of narcotics (pain medications). Allow 48-72 hours for refills.  Appointments: Please contact the [facility] scheduling department [phone] for questions regarding [facility] appointment scheduling.  Contact the schedulers with any scheduling changes so that your appointment can be rescheduled in a timely manner.   Central Scheduling for [place] [phone] - Call to schedule procedures such as PET scans, CT scans, MRI, Ultrasound, etc.  To afford each patient quality time with our providers, please arrive 30 minutes before your scheduled appointment time.  If you arrive late for your appointment, you may be asked to reschedule.  We strive to give you quality time with our providers, and arriving late affects you and other patients whose appointments are after yours. If you are a no show for multiple scheduled visits, you may be dismissed from the clinic at the providers discretion.     Resources: [facility] Social Workers [phone] for additional information on assistance programs or assistance connecting with community support programs   [place] DSS  336-641-3447: Information regarding food stamps, Medicaid, and utility assistance [place] [employer] [place] [phone]   [place] [employer]'s shared-ride transportation service for eligible riders who have a disability that prevents them from riding the fixed route bus.   Medicare  Rights Center 800-333-4114 Helps people with Medicare understand their rights and benefits, navigate the Medicare system, and secure the quality healthcare they deserve [employer] 800-227-2345 Assists patients locate various types of support and financial assistance Cancer Care: 1-800-813-HOPE (4673) Provides financial assistance, online support groups, medication/co-pay assistance.   Transportation Assistance for appointments at [facility]: Transportation Coordinator [phone]  Again, thank you for choosing [facility] for your care.       

## 2019-11-05 NOTE — Progress Notes (Signed)
HEMATOLOGY/ONCOLOGY CLINIC NOTE  Date of Service: 11/05/2019  Patient Care Team: Maurice Small, MD as PCP - General (Family Medicine) Buford Dresser, MD as PCP - Cardiology (Cardiology)  CHIEF COMPLAINTS/PURPOSE OF CONSULTATION:   MDS Large renal cystic mass  HISTORY OF PRESENTING ILLNESS:  Joseph Leach is a wonderful 86 y.o. male who has been referred to [place] by [name], PA for evaluation and management of leukopenia w/blasts. Pt is accompanied today by his son. The pt reports that he is doing well overall.   The pt reports that he sees [name], [place] for his seronegative rheumatoid arthritis. The only change that he has noticed recently is an increase in intermittent diarrhea and constipation. His son notes that the pt has had issues with constipation for years, starting directly after an episode of Gout, but thinks that the constipation may have worsened over the last few months. Pt denies any recent infections or any use of antibiotics.   Pt takes low-dose Prednisone for his RA, Allopurinol for his Gout, Carvedilol for his HTN, and Eliquis. His Gout has been well-controlled and he has not required Colchicine. Pt has received Cortisone injections in his knees on multiple occasions and once in his right shoulder. Pt and his wife walk up to a quarter of a mile per day when the weather is good.   Most recent lab results (06/13/2019) of CBC is as follows: all values are WNL except for RBC at 3.81, Hgb at 12.3, HCT at 37.4, MCV at 98, Abs Immature Granulocytes at 0.4K.  On review of systems, pt reports constipation, diarrhea and denies fevers, chills, night sweats, back pain, new bone pain, headaches, abnormal/excessive bleeding and any other symptoms.   On PMHx the pt reports Osteoarthritis, Rheumatoid Arthritis, Gout, Stage 3 CKD, Elevated C-reactive protein, HTN, Prostate cancer.  INTERVAL HISTORY: Joseph Leach is a wonderful 86 y.o. male who is here for  evaluation and management of leukopenia w/blasts. We are joined today by his son. The patient's last visit with [place] was on 09/04/2019. The pt reports that he is doing well overall.  The pt reports that he continues having significant shortness of breath, even with walking short distances, and intermittent diarrhea and constipation. Pt has been to see the Urologist, who is not interested in removing his renal cyst as it is not malignant or pressing on his diaphragm. He has also been seen by the Pulmonologist and Cardiologist who have not found the source of his shortness of breath. Pt has an upcoming stress test with his Cardiologist and is holding anticoagulation until that is complete. His Cardiologist is concerned that Carvedilol can be affecting his cardiac pumping function. Pt is very fatigued in the evenings, but often feels well rested in the morning.   Of note since the patient's last visit, pt has had MRI Abd ([id number]) completed on 09/30/2019 with results revealing "1. Large complex cyst in the right kidney categorized as Bosniak class 2, as above. 2. Multiple other simple cysts in the kidneys bilaterally (Bosniak class 1). 3. Multiple diffusion restricting enhancing lesions in the visualized portions of the skeleton concerning for potential metastatic disease. No definite primary lesion confidently identified on today's examination. 4. Small right pleural effusion lying dependently. 5. Small pericardial effusion."  Lab results today (11/05/19) of CBC w/diff and CMP is as follows: all values are WNL except for RBC at 3.69, Hgb at 10.4, HCT at 34.6, RDW at 17.9, Abs Immature Granulocytes at 0.41K, BUN at  39, Creatinine at 1.88, Total Proteinat 6.1, Albumin at 3.4, AST at 14, GFR Est 30. 11/05/2019 Ferritin at 142 11/05/2019 Iron Panel is as follows: Iron at 42, TIBC at 268, Sat Ratios at 16, UIBC at 225  On review of systems, pt reports constipation, diarrhea, SOB, leg swelling, fatigue and denies  abdominal pain, bloody/black stools, back pain, new bone pain, unexpected weight loss and any other symptoms.   MEDICAL HISTORY:  Past Medical History:  Diagnosis Date  . Hypertension   . SCC (squamous cell carcinoma) 08/02/2019   RIGHT FOREARM  (TX WITH BX)  . SCC (squamous cell carcinoma) 08/02/2019   LEFT FOREARM  (TX WITH BX)  . Squamous cell carcinoma of skin 08/08/2017   in situ-left forearm (txpbx)  . Squamous cell carcinoma of skin 01/09/2019   in situ-left forearm (txpbx)  . Squamous cell carcinoma of skin 01/09/2019   in situ-right hand proximal (txpbx)  . Squamous cell carcinoma of skin 01/09/2019   in situ-right arm (txpbx)  Osteoarthritis Rheumatoid Arthritis Gout Stage 3 CKD Elevated C-reactive protein Prostate Cancer   SURGICAL HISTORY: Past Surgical History:  Procedure Laterality Date  . APPENDECTOMY    . LIGAMENT REPAIR Right   . PROSTATECTOMY    . TOTAL HIP ARTHROPLASTY      SOCIAL HISTORY: Social History   Socioeconomic History  . Marital status: Married    Spouse name: Not on file  . Number of children: Not on file  . Years of education: Not on file  . Highest education level: Not on file  Occupational History  . Not on file  Tobacco Use  . Smoking status: Never Smoker  . Smokeless tobacco: Never Used  Vaping Use  . Vaping Use: Never used  Substance and Sexual Activity  . Alcohol use: Not Currently    Comment: occasional  . Drug use: Never  . Sexual activity: Not on file  Other Topics Concern  . Not on file  Social History Narrative  . Not on file   Social Determinants of Health   Financial Resource Strain:   . Difficulty of Paying Living Expenses: Not on file  Food Insecurity:   . Worried About Charity fundraiser in the Last Year: Not on file  . Ran Out of Food in the Last Year: Not on file  Transportation Needs:   . Lack of Transportation (Medical): Not on file  . Lack of Transportation (Non-Medical): Not on file  Physical  Activity:   . Days of Exercise per Week: Not on file  . Minutes of Exercise per Session: Not on file  Stress:   . Feeling of Stress : Not on file  Social Connections:   . Frequency of Communication with Friends and Family: Not on file  . Frequency of Social Gatherings with Friends and Family: Not on file  . Attends Religious Services: Not on file  . Active Member of Clubs or Organizations: Not on file  . Attends Archivist Meetings: Not on file  . Marital Status: Not on file  Intimate Partner Violence:   . Fear of Current or Ex-Partner: Not on file  . Emotionally Abused: Not on file  . Physically Abused: Not on file  . Sexually Abused: Not on file    FAMILY HISTORY: No family history on file.  ALLERGIES:  has No Known Allergies.  MEDICATIONS:  Current Outpatient Medications  Medication Sig Dispense Refill  . allopurinol (ZYLOPRIM) 100 MG tablet Take 100 mg by mouth  2 (two) times daily.    . carvedilol (COREG) 12.5 MG tablet Take 1 tablet in the AM and 1/2 tablet in the PM    . chlorthalidone (HYGROTON) 25 MG tablet Take 0.5 tablets (12.5 mg total) by mouth daily. 30 tablet 3  . colchicine 0.6 MG tablet Take 0.6 mg by mouth as needed. For gout flare    . diclofenac Sodium (VOLTAREN) 1 % GEL daily as needed.     Marland Kitchen DICYCLOMINE HCL IM 20 mg daily. 4 tablets daily    . predniSONE (DELTASONE) 10 MG tablet Take 7.5 mg by mouth 2 (two) times daily with a meal.      No current facility-administered medications for this visit.    REVIEW OF SYSTEMS:   A 10+ POINT REVIEW OF SYSTEMS WAS OBTAINED including neurology, dermatology, psychiatry, cardiac, respiratory, lymph, extremities, GI, GU, Musculoskeletal, constitutional, breasts, reproductive, HEENT.  All pertinent positives are noted in the HPI.  All others are negative.   PHYSICAL EXAMINATION: ECOG PERFORMANCE STATUS: 2 - Symptomatic, <50% confined to bed  . Vitals:   11/05/19 1117  BP: (!) 182/90  Pulse: 74  Resp: 18   Temp: 97.6 F (36.4 C)  SpO2: 97%   Filed Weights   11/05/19 1117  Weight: 202 lb 6.4 oz (91.8 kg)   .Body mass index is 29.89 kg/m.  Exam was given in a chair   GENERAL:alert, in no acute distress and comfortable SKIN: no acute rashes, no significant lesions EYES: conjunctiva are pink and non-injected, sclera anicteric OROPHARYNX: MMM, no exudates, no oropharyngeal erythema or ulceration NECK: supple, no JVD LYMPH:  no palpable lymphadenopathy in the cervical, axillary or inguinal regions LUNGS: clear to auscultation b/l with normal respiratory effort HEART: regular rate & rhythm ABDOMEN:  normoactive bowel sounds , non tender, not distended. No palpable hepatosplenomegaly.  Extremity: 1+ pedal edema b/l PSYCH: alert & oriented x 3 with fluent speech NEURO: no focal motor/sensory deficits  LABORATORY DATA:  I have reviewed the data as listed  . CBC Latest Ref Rng & Units 11/05/2019 09/04/2019 08/20/2019  WBC 4.0 - 10.5 K/uL 8.8 8.7 7.7  Hemoglobin 13.0 - 17.0 g/dL 10.4(L) 10.5(L) 10.0(L)  Hematocrit 39 - 52 % 34.6(L) 35.1(L) 33.3(L)  Platelets 150 - 400 K/uL 158 179 185   . CBC    Component Value Date/Time   WBC 8.8 11/05/2019 1011   RBC 3.69 (L) 11/05/2019 1011   HGB 10.4 (L) 11/05/2019 1011   HGB 10.5 (L) 09/04/2019 1441   HCT 34.6 (L) 11/05/2019 1011   PLT 158 11/05/2019 1011   PLT 179 09/04/2019 1441   MCV 93.8 11/05/2019 1011   MCH 28.2 11/05/2019 1011   MCHC 30.1 11/05/2019 1011   RDW 17.9 (H) 11/05/2019 1011   LYMPHSABS 1.5 11/05/2019 1011   MONOABS 0.7 11/05/2019 1011   EOSABS 0.0 11/05/2019 1011   BASOSABS 0.0 11/05/2019 1011    . CMP Latest Ref Rng & Units 11/05/2019 09/04/2019 08/20/2019  Glucose 70 - 99 mg/dL 96 109(H) 100(H)  BUN 8 - 23 mg/dL 39(H) 32(H) 33(H)  Creatinine 0.61 - 1.24 mg/dL 1.88(H) 1.51(H) 1.61(H)  Sodium 135 - 145 mmol/L 141 142 142  Potassium 3.5 - 5.1 mmol/L 4.5 4.4 4.0  Chloride 98 - 111 mmol/L 109 108 110  CO2 22 - 32  mmol/L 26 26 24   Calcium 8.9 - 10.3 mg/dL 10.2 10.1 10.1  Total Protein 6.5 - 8.1 g/dL 6.1(L) 6.2(L) 6.2(L)  Total Bilirubin 0.3 - 1.2  mg/dL 0.6 0.6 0.6  Alkaline Phos 38 - 126 U/L 75 78 84  AST 15 - 41 U/L 14(L) 13(L) 13(L)  ALT 0 - 44 U/L 14 11 13    08/02/2019 Dermatopathology [phone]):   07/25/2019 Bone Marrow Report ([id number]):   07/25/2019 Cytogenetics:    06/25/19 Myeloid Panel NGS:   RADIOGRAPHIC STUDIES: I have personally reviewed the radiological images as listed and agreed with the findings in the report. ECHOCARDIOGRAM COMPLETE  Result Date: 10/07/2019    ECHOCARDIOGRAM REPORT   Patient Name:   Revanth ZAKI GERTSCH Date of Exam: 10/07/2019 Medical Rec #:  [id number]           Height:       69.0 in Accession #:    [id number]          Weight:       208.4 lb Date of Birth:  1925/08/18            BSA:          2.102 m Patient Age:    75 years            BP:           160/74 mmHg Patient Gender: M                   HR:           69 bpm. Exam Location:  [facility] Procedure: 2D Echo, Cardiac Doppler and Color Doppler Indications:    I31.3 Pericardial effusion (noninflammatory); R06.02 SOB  History:        Patient has prior history of Echocardiogram examinations, most                 recent 02/09/2018. Arrythmias:Atrial Fibrillation; Risk                 Factors:Hypertension. Bilateralleg edema. Dyspnea on exertion.                 Rheumatoid arthritis.  Sonographer:    Diamond Nickel RCS Referring Phys: [phone] Shelly  1. Left ventricular ejection fraction, by estimation, is 60 to 65%. The left ventricle has normal function. The left ventricle has no regional wall motion abnormalities. There is moderate concentric left ventricular hypertrophy. Left ventricular diastolic function could not be evaluated.  2. Right ventricular systolic function is normal. The right ventricular size is normal. Tricuspid regurgitation signal is inadequate for assessing PA  pressure.  3. Left atrial size was moderately dilated.  4. Right atrial size was mildly dilated.  5. Moderate pericardial effusion. The pericardial effusion is circumferential. There is no evidence of cardiac tamponade.  6. The mitral valve is grossly normal. Trivial mitral valve regurgitation. No evidence of mitral stenosis.  7. The aortic valve is tricuspid. There is moderate calcification of the aortic valve. There is mild thickening of the aortic valve. Aortic valve regurgitation is mild. Mild aortic valve sclerosis is present, with no evidence of aortic valve stenosis. Aortic regurgitation PHT measures 518 msec.  8. Aortic dilatation noted. There is moderate dilatation of the ascending aorta, measuring 45 mm. Comparison(s): Changes from prior study are noted. Effusion slightly larger than prior, but no current evidence of tamponade. FINDINGS  Left Ventricle: Left ventricular ejection fraction, by estimation, is 60 to 65%. The left ventricle has normal function. The left ventricle has no regional wall motion abnormalities. The left ventricular internal cavity size was normal in size. There is  moderate concentric left ventricular hypertrophy.  Left ventricular diastolic function could not be evaluated due to atrial fibrillation. Left ventricular diastolic function could not be evaluated. Right Ventricle: The right ventricular size is normal. No increase in right ventricular wall thickness. Right ventricular systolic function is normal. Tricuspid regurgitation signal is inadequate for assessing PA pressure. Left Atrium: Left atrial size was moderately dilated. Right Atrium: Right atrial size was mildly dilated. Pericardium: A moderately sized pericardial effusion is present. The pericardial effusion is circumferential. There is no evidence of cardiac tamponade. Mitral Valve: The mitral valve is grossly normal. Mild to moderate mitral annular calcification. Trivial mitral valve regurgitation. No evidence of mitral  valve stenosis. Tricuspid Valve: The tricuspid valve is normal in structure. Tricuspid valve regurgitation is trivial. No evidence of tricuspid stenosis. Aortic Valve: The aortic valve is tricuspid. There is moderate calcification of the aortic valve. There is mild thickening of the aortic valve. There is mild to moderate aortic valve annular calcification. Aortic valve regurgitation is mild. Aortic regurgitation PHT measures 518 msec. Mild aortic valve sclerosis is present, with no evidence of aortic valve stenosis. Pulmonic Valve: The pulmonic valve was grossly normal. Pulmonic valve regurgitation is trivial. No evidence of pulmonic stenosis. Aorta: Aortic dilatation noted. There is moderate dilatation of the ascending aorta, measuring 45 mm. Venous: The inferior vena cava was not well visualized. IAS/Shunts: The atrial septum is grossly normal.  LEFT VENTRICLE PLAX 2D LVIDd:         3.90 cm LVIDs:         2.00 cm LV PW:         1.60 cm LV IVS:        1.70 cm LVOT diam:     2.20 cm LV SV:         62 LV SV Index:   29 LVOT Area:     3.80 cm  RIGHT VENTRICLE RV Basal diam:  1.70 cm TAPSE (M-mode): 2.0 cm LEFT ATRIUM              Index       RIGHT ATRIUM           Index LA diam:        4.00 cm  1.90 cm/m  RA Area:     20.70 cm LA Vol (A2C):   81.4 ml  38.72 ml/m RA Volume:   42.20 ml  20.07 ml/m LA Vol (A4C):   118.0 ml 56.13 ml/m LA Biplane Vol: 98.5 ml  46.85 ml/m  AORTIC VALVE LVOT Vmax:   67.90 cm/s LVOT Vmean:  46.433 cm/s LVOT VTI:    0.163 m AI PHT:      518 msec  AORTA Ao Root diam: 3.70 cm  SHUNTS Systemic VTI:  0.16 m Systemic Diam: 2.20 cm Buford Dresser MD Electronically signed by Buford Dresser MD Signature Date/Time: 10/07/2019/11:59:32 PM    Final     ASSESSMENT & PLAN:   86 yo with seronegative RA on prednisone with   1) Leucocytosis with immature granulocytic cells and few peripheral blasts 2) macrocytosis Anemia 3) Large complex cystic rt renal mass -08/05/2019 CT  Abd/Pel ([id number])  revealed "1. Large complex cystic right renal mass. Malignancy cannot be excluded. No evidence of metastatic disease. 2. Moderate pericardial effusion. 3. Areas of nodular subpleural consolidation in the left lower lobe may be infectious/inflammatory in etiology. 4. Small bilateral pleural effusions, right greater than left."  PLAN: -Discussed pt labwork today, 11/05/19; blood chemistries are steady, blood counts are holding, Iron sat is low,  Ferritin is okay. -Discussed 09/30/2019 MRI Abd ([id number]) which revealed "1. Large complex cyst in the right kidney categorized as Bosniak class 2, as above. 2. Multiple other simple cysts in the kidneys bilaterally (Bosniak class 1). 3. Multiple diffusion restricting enhancing lesions in the visualized portions of the skeleton concerning for potential metastatic disease." -Advised pt that bone changes observed on MRI could be metabolic bone changes or metastatic disease. -No lab or clinical evidence of MDS/MPN progression at this time. -Goal Ferritin >=250 due to CKD, would prefer Sat Ratios >= 30 -Advised pt that iron deficiency can contribute to shortness of breath.  -Advised pt that PO Iron can worsen constipation, although Iron Polysaccharide is a better preparation.  -Discussed replacing Iron IV for rapid improvement and to prevent worsening constipation - pt prefers this.  -Advised pt that if Cardiology is interested in restarting pt on Eliquis would recommend 1/2 dose due to renal function and age.  -Recommend pt continue to use compression socks and elevate legs. -Will give IV Injectafer x1 -Will get PET/CT in 1 week -Will see back in 3 months with labs -Will see back in 2 weeks via phone   FOLLOW UP: IV Injectafer weekly x 1 dose PET/CT in 1 week Phone visit in 2 weeks RTC with Dr Irene Limbo with labs in 3 months    The total time spent in the appt was 30 minutes and more than 50% was on counseling and direct patient  cares.  All of the patient's questions were answered with apparent satisfaction. The patient knows to call the clinic with any problems, questions or concerns.    Sullivan Lone MD [place] AAHIVMS [facility] Hematology/Oncology Physician [facility]  (Office):       [phone] (Work cell):  [phone] (Fax):           [phone]  11/05/2019 11:56 AM  I, Yevette Edwards, am acting as a scribe for Dr. Sullivan Lone.   .I have reviewed the above documentation for accuracy and completeness, and I agree with the above. Brunetta Genera MD

## 2019-11-06 ENCOUNTER — Telehealth: Payer: Self-pay | Admitting: Hematology

## 2019-11-06 NOTE — Telephone Encounter (Signed)
Scheduled per 10/19 los, spoke with patient's son and patient will be notified of upcoming appointments.

## 2019-11-08 ENCOUNTER — Other Ambulatory Visit: Payer: Self-pay

## 2019-11-08 ENCOUNTER — Other Ambulatory Visit: Payer: Self-pay | Admitting: Hematology

## 2019-11-08 ENCOUNTER — Inpatient Hospital Stay: Payer: Medicare Other

## 2019-11-08 ENCOUNTER — Telehealth (HOSPITAL_COMMUNITY): Payer: Self-pay | Admitting: *Deleted

## 2019-11-08 VITALS — BP 146/83 | HR 59 | Temp 98.1°F | Resp 18

## 2019-11-08 DIAGNOSIS — D7589 Other specified diseases of blood and blood-forming organs: Secondary | ICD-10-CM | POA: Diagnosis not present

## 2019-11-08 DIAGNOSIS — Z23 Encounter for immunization: Secondary | ICD-10-CM

## 2019-11-08 DIAGNOSIS — D509 Iron deficiency anemia, unspecified: Secondary | ICD-10-CM | POA: Diagnosis not present

## 2019-11-08 DIAGNOSIS — D508 Other iron deficiency anemias: Secondary | ICD-10-CM

## 2019-11-08 DIAGNOSIS — N183 Chronic kidney disease, stage 3 unspecified: Secondary | ICD-10-CM | POA: Diagnosis not present

## 2019-11-08 MED ORDER — SODIUM CHLORIDE 0.9 % IV SOLN
750.0000 mg | Freq: Once | INTRAVENOUS | Status: AC
  Administered 2019-11-08: 750 mg via INTRAVENOUS
  Filled 2019-11-08: qty 15

## 2019-11-08 MED ORDER — LORATADINE 10 MG PO TABS
10.0000 mg | ORAL_TABLET | Freq: Once | ORAL | Status: AC
  Administered 2019-11-08: 10 mg via ORAL

## 2019-11-08 MED ORDER — ACETAMINOPHEN 325 MG PO TABS
650.0000 mg | ORAL_TABLET | Freq: Once | ORAL | Status: AC
  Administered 2019-11-08: 650 mg via ORAL

## 2019-11-08 MED ORDER — SODIUM CHLORIDE 0.9 % IV SOLN
Freq: Once | INTRAVENOUS | Status: AC
  Filled 2019-11-08: qty 250

## 2019-11-08 MED ORDER — INFLUENZA VAC A&B SA ADJ QUAD 0.5 ML IM PRSY
PREFILLED_SYRINGE | INTRAMUSCULAR | Status: AC
  Filled 2019-11-08: qty 0.5

## 2019-11-08 MED ORDER — LORATADINE 10 MG PO TABS
ORAL_TABLET | ORAL | Status: AC
  Filled 2019-11-08: qty 1

## 2019-11-08 MED ORDER — ACETAMINOPHEN 325 MG PO TABS
ORAL_TABLET | ORAL | Status: AC
  Filled 2019-11-08: qty 2

## 2019-11-08 MED ORDER — INFLUENZA VAC A&B SA ADJ QUAD 0.5 ML IM PRSY
0.5000 mL | PREFILLED_SYRINGE | Freq: Once | INTRAMUSCULAR | Status: AC
  Administered 2019-11-08: 0.5 mL via INTRAMUSCULAR

## 2019-11-08 NOTE — Telephone Encounter (Signed)
Close encounter 

## 2019-11-08 NOTE — Patient Instructions (Signed)
Ferric carboxymaltose injection What is this medicine? FERRIC CARBOXYMALTOSE (ferr-ik car-box-ee-mol-toes) is an iron complex. Iron is used to make healthy red blood cells, which carry oxygen and nutrients throughout the body. This medicine is used to treat anemia in people with chronic kidney disease or people who cannot take iron by mouth. This medicine may be used for other purposes; ask your health care provider or pharmacist if you have questions. COMMON BRAND NAME(S): Injectafer What should I tell my health care provider before I take this medicine? They need to know if you have any of these conditions:  high levels of iron in the blood  liver disease  an unusual or allergic reaction to iron, other medicines, foods, dyes, or preservatives  pregnant or trying to get pregnant  breast-feeding How should I use this medicine? This medicine is for infusion into a vein. It is given by a health care professional in a hospital or clinic setting. Talk to your pediatrician regarding the use of this medicine in children. Special care may be needed. Overdosage: If you think you have taken too much of this medicine contact a poison control center or emergency room at once. NOTE: This medicine is only for you. Do not share this medicine with others. What if I miss a dose? It is important not to miss your dose. Call your doctor or health care professional if you are unable to keep an appointment. What may interact with this medicine? Do not take this medicine with any of the following medications:  deferoxamine  dimercaprol  other iron products This list may not describe all possible interactions. Give your health care provider a list of all the medicines, herbs, non-prescription drugs, or dietary supplements you use. Also tell them if you smoke, drink alcohol, or use illegal drugs. Some items may interact with your medicine. What should I watch for while using this medicine? Visit your  doctor or health care professional regularly. Tell your doctor if your symptoms do not start to get better or if they get worse. You may need blood work done while you are taking this medicine. You may need to follow a special diet. Talk to your doctor. Foods that contain iron include: whole grains/cereals, dried fruits, beans, or peas, leafy green vegetables, and organ meats (liver, kidney). What side effects may I notice from receiving this medicine? Side effects that you should report to your doctor or health care professional as soon as possible:  allergic reactions like skin rash, itching or hives, swelling of the face, lips, or tongue  dizziness  facial flushing Side effects that usually do not require medical attention (report to your doctor or health care professional if they continue or are bothersome):  changes in taste  constipation  headache  nausea, vomiting  pain, redness, or irritation at site where injected This list may not describe all possible side effects. Call your doctor for medical advice about side effects. You may report side effects to FDA at 1-800-FDA-1088. Where should I keep my medicine? This drug is given in a hospital or clinic and will not be stored at home. NOTE: This sheet is a summary. It may not cover all possible information. If you have questions about this medicine, talk to your doctor, pharmacist, or health care provider.  2020 Elsevier/Gold Standard (2016-02-18 09:40:29)  Influenza Virus Vaccine injection What is this medicine? INFLUENZA VIRUS VACCINE (in floo EN zuh VAHY ruhs vak SEEN) helps to reduce the risk of getting influenza also known as  the flu. The vaccine only helps protect you against some strains of the flu. This medicine may be used for other purposes; ask your health care provider or pharmacist if you have questions. COMMON BRAND NAME(S): Afluria, Afluria Quadrivalent, Agriflu, Alfuria, FLUAD, Fluarix, Fluarix Quadrivalent,  Flublok, Flublok Quadrivalent, FLUCELVAX, FLUCELVAX Quadrivalent, Flulaval, Flulaval Quadrivalent, Fluvirin, Fluzone, Fluzone High-Dose, Fluzone Intradermal, Fluzone Quadrivalent What should I tell my health care provider before I take this medicine? They need to know if you have any of these conditions:  bleeding disorder like hemophilia  fever or infection  Guillain-Barre syndrome or other neurological problems  immune system problems  infection with the human immunodeficiency virus (HIV) or AIDS  low blood platelet counts  multiple sclerosis  an unusual or allergic reaction to influenza virus vaccine, latex, other medicines, foods, dyes, or preservatives. Different brands of vaccines contain different allergens. Some may contain latex or eggs. Talk to your doctor about your allergies to make sure that you get the right vaccine.  pregnant or trying to get pregnant  breast-feeding How should I use this medicine? This vaccine is for injection into a muscle or under the skin. It is given by a health care professional. A copy of Vaccine Information Statements will be given before each vaccination. Read this sheet carefully each time. The sheet may change frequently. Talk to your healthcare provider to see which vaccines are right for you. Some vaccines should not be used in all age groups. Overdosage: If you think you have taken too much of this medicine contact a poison control center or emergency room at once. NOTE: This medicine is only for you. Do not share this medicine with others. What if I miss a dose? This does not apply. What may interact with this medicine?  chemotherapy or radiation therapy  medicines that lower your immune system like etanercept, anakinra, infliximab, and adalimumab  medicines that treat or prevent blood clots like warfarin  phenytoin  steroid medicines like prednisone or cortisone  theophylline  vaccines This list may not describe all  possible interactions. Give your health care provider a list of all the medicines, herbs, non-prescription drugs, or dietary supplements you use. Also tell them if you smoke, drink alcohol, or use illegal drugs. Some items may interact with your medicine. What should I watch for while using this medicine? Report any side effects that do not go away within 3 days to your doctor or health care professional. Call your health care provider if any unusual symptoms occur within 6 weeks of receiving this vaccine. You may still catch the flu, but the illness is not usually as bad. You cannot get the flu from the vaccine. The vaccine will not protect against colds or other illnesses that may cause fever. The vaccine is needed every year. What side effects may I notice from receiving this medicine? Side effects that you should report to your doctor or health care professional as soon as possible:  allergic reactions like skin rash, itching or hives, swelling of the face, lips, or tongue Side effects that usually do not require medical attention (report to your doctor or health care professional if they continue or are bothersome):  fever  headache  muscle aches and pains  pain, tenderness, redness, or swelling at the injection site  tiredness This list may not describe all possible side effects. Call your doctor for medical advice about side effects. You may report side effects to FDA at 1-800-FDA-1088. Where should I keep my medicine? The  vaccine will be given by a health care professional in a clinic, pharmacy, doctor's office, or other health care setting. You will not be given vaccine doses to store at home. NOTE: This sheet is a summary. It may not cover all possible information. If you have questions about this medicine, talk to your doctor, pharmacist, or health care provider.  2020 Elsevier/Gold Standard (2017-11-28 08:45:43)

## 2019-11-12 ENCOUNTER — Ambulatory Visit (HOSPITAL_COMMUNITY)
Admission: RE | Admit: 2019-11-12 | Discharge: 2019-11-12 | Disposition: A | Discharge status: Home / Self Care | Payer: Medicare Other | Source: Ambulatory Visit | Attending: Cardiovascular Disease | Admitting: Cardiovascular Disease

## 2019-11-12 ENCOUNTER — Other Ambulatory Visit: Payer: Self-pay

## 2019-11-12 DIAGNOSIS — R0600 Dyspnea, unspecified: Secondary | ICD-10-CM

## 2019-11-12 DIAGNOSIS — R0609 Other forms of dyspnea: Secondary | ICD-10-CM

## 2019-11-19 ENCOUNTER — Inpatient Hospital Stay: Payer: Medicare Other | Admitting: Hematology

## 2019-11-19 ENCOUNTER — Telehealth (HOSPITAL_COMMUNITY): Payer: Self-pay | Admitting: *Deleted

## 2019-11-19 NOTE — Telephone Encounter (Signed)
Close encounter 

## 2019-11-20 ENCOUNTER — Ambulatory Visit (HOSPITAL_COMMUNITY)
Admission: RE | Admit: 2019-11-20 | Discharge: 2019-11-20 | Disposition: A | Discharge status: Home / Self Care | Payer: Medicare Other | Source: Ambulatory Visit | Attending: Cardiology | Admitting: Cardiology

## 2019-11-20 ENCOUNTER — Other Ambulatory Visit: Payer: Self-pay

## 2019-11-20 DIAGNOSIS — R0600 Dyspnea, unspecified: Secondary | ICD-10-CM | POA: Insufficient documentation

## 2019-11-20 LAB — MYOCARDIAL PERFUSION IMAGING
LV dias vol: 115 mL (ref 62–150)
LV sys vol: 50 mL
Peak HR: 75 {beats}/min
Rest HR: 65 {beats}/min
SDS: 1
SRS: 1
SSS: 2
TID: 1.05

## 2019-11-20 MED ORDER — TECHNETIUM TC 99M TETROFOSMIN IV KIT
30.1000 | PACK | Freq: Once | INTRAVENOUS | Status: AC | PRN
  Administered 2019-11-20: 30.1 via INTRAVENOUS
  Filled 2019-11-20: qty 31

## 2019-11-20 MED ORDER — TECHNETIUM TC 99M TETROFOSMIN IV KIT
10.9000 | PACK | Freq: Once | INTRAVENOUS | Status: AC | PRN
  Administered 2019-11-20: 10.9 via INTRAVENOUS
  Filled 2019-11-20: qty 11

## 2019-11-20 MED ORDER — REGADENOSON 0.4 MG/5ML IV SOLN
0.4000 mg | Freq: Once | INTRAVENOUS | Status: AC
  Administered 2019-11-20: 0.4 mg via INTRAVENOUS

## 2019-11-21 NOTE — Telephone Encounter (Signed)
Can we move up his appt to November to talk about blood pressure, thanks.

## 2019-11-21 NOTE — Telephone Encounter (Signed)
Left message to call back  

## 2019-11-21 NOTE — Telephone Encounter (Signed)
Spoke to son and scheduled appointment for tomorrow 11/5 with Dr. Harrell Gave.

## 2019-11-22 ENCOUNTER — Other Ambulatory Visit: Payer: Self-pay

## 2019-11-22 ENCOUNTER — Ambulatory Visit (INDEPENDENT_AMBULATORY_CARE_PROVIDER_SITE_OTHER): Payer: Medicare Other | Admitting: Cardiology

## 2019-11-22 ENCOUNTER — Encounter: Payer: Self-pay | Admitting: Cardiology

## 2019-11-22 VITALS — BP 138/69 | HR 70 | Ht 69.0 in | Wt 202.0 lb

## 2019-11-22 DIAGNOSIS — I482 Chronic atrial fibrillation, unspecified: Secondary | ICD-10-CM | POA: Diagnosis not present

## 2019-11-22 DIAGNOSIS — I10 Essential (primary) hypertension: Secondary | ICD-10-CM

## 2019-11-22 DIAGNOSIS — R600 Localized edema: Secondary | ICD-10-CM

## 2019-11-22 DIAGNOSIS — R0600 Dyspnea, unspecified: Secondary | ICD-10-CM

## 2019-11-22 DIAGNOSIS — I313 Pericardial effusion (noninflammatory): Secondary | ICD-10-CM

## 2019-11-22 DIAGNOSIS — R0609 Other forms of dyspnea: Secondary | ICD-10-CM

## 2019-11-22 DIAGNOSIS — Z712 Person consulting for explanation of examination or test findings: Secondary | ICD-10-CM

## 2019-11-22 DIAGNOSIS — I3139 Other pericardial effusion (noninflammatory): Secondary | ICD-10-CM

## 2019-11-22 MED ORDER — AMLODIPINE BESYLATE 5 MG PO TABS: 5.0000 mg | ORAL_TABLET | Freq: Every day | ORAL | 3 refills | Status: AC

## 2019-11-22 NOTE — Progress Notes (Signed)
Cardiology Office Note:    Date:  11/22/2019   ID:  Joseph Leach, DOB 1925-03-06, MRN [id number]  PCP:  Maurice Small, MD  Cardiologist:  Buford Dresser, MD  Referring MD: Maurice Small, MD   CC: follow up  History of Present Illness:    Joseph Leach is a 86 y.o. male with a hx of atrial fibrillation, diagnosed 2020, rheumatoid arthritis,  who is seen as a for follow up. I initially met him 09/25/19 as a new consult at the request of Maurice Small, MD for the evaluation and management of pericardial effusion.  CV history: CT scan 08/05/19. Per read, "Heart is enlarged. Decreased attenuation of the intravascular compartment is indicative of anemia. Moderate pericardial effusion. Atherosclerotic calcification of the aorta, aortic valve and coronary arteries."  They were told they may need to have this drained.  Today: Here with his son today. Seen for elevated blood pressures. Reviewed mychart messages and home numbers. Wears out easily with activity or exertion. Has had good days and bad days, today is a bad day.   BP range 031-594 systolic, most 585-929. Most diastolic 24-46 mmHg.  Doesn't feel that heart rate elevates significantly with activity.   Reviewed results of stress test today as well.  Remains weak. Fatigued. Remains in wheelchair, needs help with ADLs.   Takes 1/2 tab carvedilol at night and 1 pill carvedilol in AM.  Swelling improved with compression stockings. Swelling L>R but improved.   Discussed restarting apixaban today. Last test is scheduled for 11/9. No indications thus far that any invasive testing needed. Will plan to restart apixaban if testing unremarkable and he is doing well.   Past Medical History:  Diagnosis Date  . Hypertension   . SCC (squamous cell carcinoma) 08/02/2019   RIGHT FOREARM  (TX WITH BX)  . SCC (squamous cell carcinoma) 08/02/2019   LEFT FOREARM  (TX WITH BX)  . Squamous cell carcinoma of skin 08/08/2017   in situ-left  forearm (txpbx)  . Squamous cell carcinoma of skin 01/09/2019   in situ-left forearm (txpbx)  . Squamous cell carcinoma of skin 01/09/2019   in situ-right hand proximal (txpbx)  . Squamous cell carcinoma of skin 01/09/2019   in situ-right arm (txpbx)    Past Surgical History:  Procedure Laterality Date  . APPENDECTOMY    . LIGAMENT REPAIR Right   . PROSTATECTOMY    . TOTAL HIP ARTHROPLASTY      Current Medications: Current Outpatient Medications on File Prior to Visit  Medication Sig  . allopurinol (ZYLOPRIM) 100 MG tablet Take 100 mg by mouth 2 (two) times daily.  . carvedilol (COREG) 12.5 MG tablet Take 1 tablet in the AM and 1/2 tablet in the PM  . chlorthalidone (HYGROTON) 25 MG tablet Take 0.5 tablets (12.5 mg total) by mouth daily.  . colchicine 0.6 MG tablet Take 0.6 mg by mouth as needed. For gout flare  . diclofenac Sodium (VOLTAREN) 1 % GEL daily as needed.   . dicyclomine (BENTYL) 20 MG tablet   . DICYCLOMINE HCL IM 20 mg daily. 4 tablets daily  . predniSONE (DELTASONE) 1 MG tablet   . predniSONE (DELTASONE) 10 MG tablet Take 7.5 mg by mouth 2 (two) times daily with a meal.   . predniSONE (DELTASONE) 5 MG tablet    No current facility-administered medications on file prior to visit.     Allergies:   Patient has no known allergies.   Social History   Tobacco Use  .  Smoking status: Never Smoker  . Smokeless tobacco: Never Used  Vaping Use  . Vaping Use: Never used  Substance Use Topics  . Alcohol use: Not Currently    Comment: occasional  . Drug use: Never    Family History: Denies family history of early cardiovascular disease  ROS:   Please see the history of present illness.  Additional pertinent ROS otherwise unremarkable   EKGs/Labs/Other Studies Reviewed:    The following studies were reviewed today: Stress test 11/20/19  The left ventricular ejection fraction is normal (55-65%).  Nuclear stress EF: 56%.  There was no ST segment deviation  noted during stress.  Defect 1: There is a small defect of mild severity present in the apical inferior location.  This is a low risk study. There is no significant ischemia identified.    Echo 10/07/19 1. Left ventricular ejection fraction, by estimation, is 60 to 65%. The  left ventricle has normal function. The left ventricle has no regional  wall motion abnormalities. There is moderate concentric left ventricular  hypertrophy. Left ventricular  diastolic function could not be evaluated.  2. Right ventricular systolic function is normal. The right ventricular  size is normal. Tricuspid regurgitation signal is inadequate for assessing  PA pressure.  3. Left atrial size was moderately dilated.  4. Right atrial size was mildly dilated.  5. Moderate pericardial effusion. The pericardial effusion is  circumferential. There is no evidence of cardiac tamponade.  6. The mitral valve is grossly normal. Trivial mitral valve  regurgitation. No evidence of mitral stenosis.  7. The aortic valve is tricuspid. There is moderate calcification of the  aortic valve. There is mild thickening of the aortic valve. Aortic valve  regurgitation is mild. Mild aortic valve sclerosis is present, with no  evidence of aortic valve stenosis.  Aortic regurgitation PHT measures 518 msec.  8. Aortic dilatation noted. There is moderate dilatation of the ascending  aorta, measuring 45 mm.   Comparison(s): Changes from prior study are noted. Effusion slightly  larger than prior, but no current evidence of tamponade.   Echo 02/09/18 Left ventricle: The cavity size was normal. Wall thickness was  increased in a pattern of moderate LVH. Systolic function was  normal. The estimated ejection fraction was in the range of 55%  to 60%. Doppler parameters are consistent with abnormal left  ventricular relaxation (grade 1 diastolic dysfunction).  - Aortic valve: Sclerosis without stenosis. Valve area  (VTI): 1.5  cm^2. Valve area (Vmax): 1.74 cm^2. Valve area (Vmean): 1.71  cm^2.  - Mitral valve: Calcified annulus.  - Left atrium: The atrium was severely dilated.  - Atrial septum: No defect or patent foramen ovale was identified.  - Pericardium, extracardiac: Small pericardial effusion mostly over  the RV no tamponade.   EKG:  EKG is personally reviewed.  The ekg ordered 09/25/19 demonstrates atrial fibrillation, RBBB, HR 67  Recent Labs: 11/05/2019: ALT 14; BUN 39; Creatinine 1.88; Hemoglobin 10.4; Platelets 158; Potassium 4.5; Sodium 141  Recent Lipid Panel No results found for: CHOL, TRIG, HDL, CHOLHDL, VLDL, LDLCALC, LDLDIRECT  Physical Exam:    VS:  BP 138/69   Pulse 70   Ht _0  (1.753 m)   Wt 202 lb (91.6 kg)   SpO2 98%   BMI 29.83 kg/m     Wt Readings from Last 3 Encounters:  11/22/19 202 lb (91.6 kg)  11/20/19 202 lb (91.6 kg)  11/05/19 202 lb 6.4 oz (91.8 kg)    GEN: frail  appearing, in no acute distress HEENT: Normal, moist mucous membranes NECK: No JVD CARDIAC: regular rhythm, normal S1 and S2, no rubs or gallops. No murmur. VASCULAR: Radial and DP pulses 2+ bilaterally. No carotid bruits RESPIRATORY:  Clear to auscultation without rales, wheezing or rhonchi  ABDOMEN: Soft, non-tender, non-distended MUSCULOSKELETAL:  Ambulates independently SKIN: Warm and dry, bilateral 1+ LE edema NEUROLOGIC:  No focal neuro deficits noted. PSYCHIATRIC:  Normal affect   ASSESSMENT:    1. Essential hypertension   2. Pericardial effusion   3. Chronic atrial fibrillation ([place])   4. Bilateral leg edema   5. Encounter to discuss test results   6. Dyspnea on exertion    PLAN:    Hypertension: -reviewed home numbers -discussed that we do not want to be over-aggressive -will wean off carvedilol given fatigue and change to amlodipine -cautioned to watch for worsening LE edema  Pericardial effusion seen on CT: -echocardiogram without evidence of  tamponade -discussed indications for pericardiocentesis. They would like to avoid procedures unless life threatening event -unclear etiology. Being followed in urology for complex renal mass.  Atrial fibrillation, chronic: -reports he is asymptomatic in afib -CHA2DS2/VAS Stroke Risk Points=3 -we discussed apixaban today. They will confirm no plans for invasive tests, and then we can discuss restarting apixaban.   Bilateral LE edema, dyspnea on exertion: -echo unremarkable, reviewed test results -stress test unremarkable, reviewed test results today -LE edema improved with compression stocking -discussed DOE as anginal equivalent. After extensive shared decision making, they wish to proceed with lexiscan myoview stress test  Plan for follow up:6 weeks or sooner as needed. Check in via mychart in 2 weeks  Medication Adjustments/Labs and Tests Ordered: Current medicines are reviewed at length with the patient today.  Concerns regarding medicines are outlined above.  No orders of the defined types were placed in this encounter.  Meds ordered this encounter  Medications  . amLODipine (NORVASC) 5 MG tablet    Sig: Take 1 tablet (5 mg total) by mouth daily.    Dispense:  90 tablet    Refill:  3    Patient Instructions  Medication Instructions:  Take 1/2 tab of carvedilol both morning and night for the next week, then stop. In the meantime, start amlodipine 5 mg daily (either morning or night). Follow blood pressure and heart rates. Send me a list in about two weeks and let me know how things are going.   *If you need a refill on your cardiac medications before your next appointment, please call your pharmacy*   Lab Work: None   Testing/Procedures: None   Follow-Up: At [facility], you and your health needs are our priority.  As part of our continuing mission to provide you with exceptional heart care, we have created designated Provider Care Teams.  These Care Teams include  your primary Cardiologist (physician) and Advanced Practice Providers (APPs -  Physician Assistants and Nurse Practitioners) who all work together to provide you with the care you need, when you need it.  We recommend signing up for the patient portal called "MyChart".  Sign up information is provided on this After Visit Summary.  MyChart is used to connect with patients for Virtual Visits (Telemedicine).  Patients are able to view lab/test results, encounter notes, upcoming appointments, etc.  Non-urgent messages can be sent to your provider as well.   To learn more about what you can do with MyChart, go to [email or web address].    Your next appointment:   6 week(s)  The format for your next appointment:   In Person  Provider:   Buford Dresser, MD      Signed, Buford Dresser, MD PhD 11/22/2019  [facility]

## 2019-11-22 NOTE — Patient Instructions (Addendum)
Medication Instructions:  Take 1/2 tab of carvedilol both morning and night for the next week, then stop. In the meantime, start amlodipine 5 mg daily (either morning or night). Follow blood pressure and heart rates. Send me a list in about two weeks and let me know how things are going.   *If you need a refill on your cardiac medications before your next appointment, please call your pharmacy*   Lab Work: None   Testing/Procedures: None   Follow-Up: At [facility], you and your health needs are our priority.  As part of our continuing mission to provide you with exceptional heart care, we have created designated Provider Care Teams.  These Care Teams include your primary Cardiologist (physician) and Advanced Practice Providers (APPs -  Physician Assistants and Nurse Practitioners) who all work together to provide you with the care you need, when you need it.  We recommend signing up for the patient portal called "MyChart".  Sign up information is provided on this After Visit Summary.  MyChart is used to connect with patients for Virtual Visits (Telemedicine).  Patients are able to view lab/test results, encounter notes, upcoming appointments, etc.  Non-urgent messages can be sent to your provider as well.   To learn more about what you can do with MyChart, go to [email or web address].    Your next appointment:   6 week(s)  The format for your next appointment:   In Person  Provider:   Buford Dresser, MD

## 2019-11-25 NOTE — Progress Notes (Signed)
This encounter was created in error - please disregard.

## 2019-11-26 ENCOUNTER — Ambulatory Visit (HOSPITAL_COMMUNITY): Admission: RE | Admit: 2019-11-26 | Payer: Medicare Other | Source: Ambulatory Visit

## 2019-11-28 ENCOUNTER — Inpatient Hospital Stay: Payer: Medicare Other | Admitting: Hematology

## 2019-11-28 ENCOUNTER — Telehealth: Payer: Self-pay | Admitting: Hematology

## 2019-11-28 DIAGNOSIS — J069 Acute upper respiratory infection, unspecified: Secondary | ICD-10-CM | POA: Diagnosis not present

## 2019-11-28 NOTE — Telephone Encounter (Signed)
Called pt per 11/11 sch msg - no answer. Left message for pt son to call back to reschedule appt.

## 2019-12-04 NOTE — Progress Notes (Signed)
This encounter was created in error - please disregard.

## 2019-12-11 DIAGNOSIS — D469 Myelodysplastic syndrome, unspecified: Secondary | ICD-10-CM | POA: Diagnosis not present

## 2019-12-11 DIAGNOSIS — I4891 Unspecified atrial fibrillation: Secondary | ICD-10-CM | POA: Diagnosis not present

## 2019-12-11 DIAGNOSIS — K582 Mixed irritable bowel syndrome: Secondary | ICD-10-CM | POA: Diagnosis not present

## 2019-12-11 DIAGNOSIS — I10 Essential (primary) hypertension: Secondary | ICD-10-CM | POA: Diagnosis not present

## 2019-12-18 DIAGNOSIS — M109 Gout, unspecified: Secondary | ICD-10-CM | POA: Diagnosis not present

## 2019-12-18 DIAGNOSIS — D649 Anemia, unspecified: Secondary | ICD-10-CM | POA: Diagnosis not present

## 2019-12-18 DIAGNOSIS — R0602 Shortness of breath: Secondary | ICD-10-CM | POA: Diagnosis not present

## 2019-12-18 DIAGNOSIS — N179 Acute kidney failure, unspecified: Secondary | ICD-10-CM | POA: Diagnosis not present

## 2019-12-18 DIAGNOSIS — N189 Chronic kidney disease, unspecified: Secondary | ICD-10-CM | POA: Diagnosis not present

## 2019-12-19 DIAGNOSIS — R0602 Shortness of breath: Secondary | ICD-10-CM | POA: Diagnosis not present

## 2019-12-19 DIAGNOSIS — D649 Anemia, unspecified: Secondary | ICD-10-CM | POA: Diagnosis not present

## 2019-12-19 DIAGNOSIS — N189 Chronic kidney disease, unspecified: Secondary | ICD-10-CM | POA: Diagnosis not present

## 2019-12-19 DIAGNOSIS — D469 Myelodysplastic syndrome, unspecified: Secondary | ICD-10-CM | POA: Diagnosis not present

## 2019-12-19 DIAGNOSIS — E538 Deficiency of other specified B group vitamins: Secondary | ICD-10-CM | POA: Diagnosis not present

## 2019-12-23 DIAGNOSIS — D649 Anemia, unspecified: Secondary | ICD-10-CM | POA: Diagnosis not present

## 2019-12-23 DIAGNOSIS — N189 Chronic kidney disease, unspecified: Secondary | ICD-10-CM | POA: Diagnosis not present

## 2019-12-23 DIAGNOSIS — N179 Acute kidney failure, unspecified: Secondary | ICD-10-CM | POA: Diagnosis not present

## 2019-12-23 DIAGNOSIS — I10 Essential (primary) hypertension: Secondary | ICD-10-CM | POA: Diagnosis not present

## 2019-12-24 DIAGNOSIS — D649 Anemia, unspecified: Secondary | ICD-10-CM | POA: Diagnosis not present

## 2019-12-24 DIAGNOSIS — E559 Vitamin D deficiency, unspecified: Secondary | ICD-10-CM | POA: Diagnosis not present

## 2019-12-24 DIAGNOSIS — E119 Type 2 diabetes mellitus without complications: Secondary | ICD-10-CM | POA: Diagnosis not present

## 2019-12-25 DIAGNOSIS — R1903 Right lower quadrant abdominal swelling, mass and lump: Secondary | ICD-10-CM | POA: Diagnosis not present

## 2019-12-25 DIAGNOSIS — N179 Acute kidney failure, unspecified: Secondary | ICD-10-CM | POA: Diagnosis not present

## 2019-12-25 DIAGNOSIS — R600 Localized edema: Secondary | ICD-10-CM | POA: Diagnosis not present

## 2019-12-25 DIAGNOSIS — E538 Deficiency of other specified B group vitamins: Secondary | ICD-10-CM | POA: Diagnosis not present

## 2019-12-25 DIAGNOSIS — D469 Myelodysplastic syndrome, unspecified: Secondary | ICD-10-CM | POA: Diagnosis not present

## 2019-12-25 DIAGNOSIS — D649 Anemia, unspecified: Secondary | ICD-10-CM | POA: Diagnosis not present

## 2019-12-25 DIAGNOSIS — I10 Essential (primary) hypertension: Secondary | ICD-10-CM | POA: Diagnosis not present

## 2019-12-25 DIAGNOSIS — N189 Chronic kidney disease, unspecified: Secondary | ICD-10-CM | POA: Diagnosis not present

## 2019-12-28 DIAGNOSIS — M6281 Muscle weakness (generalized): Secondary | ICD-10-CM | POA: Diagnosis not present

## 2019-12-31 DIAGNOSIS — B351 Tinea unguium: Secondary | ICD-10-CM | POA: Diagnosis not present

## 2019-12-31 DIAGNOSIS — M6281 Muscle weakness (generalized): Secondary | ICD-10-CM | POA: Diagnosis not present

## 2020-01-01 ENCOUNTER — Ambulatory Visit: Payer: Medicare Other | Admitting: Cardiology

## 2020-01-01 NOTE — Progress Notes (Incomplete)
Cardiology Office Note:    Date:  01/01/2020   ID:  Joseph Leach, DOB [birth year], MRN [id number]  PCP:  Maurice Small, MD  Cardiologist:  Buford Dresser, MD  Referring MD: Maurice Small, MD   CC: follow up  History of Present Illness:    Joseph Leach is a 86 y.o. male with a hx of atrial fibrillation, diagnosed 2020, rheumatoid arthritis,  who is seen as a for follow up. I initially met him 09/25/19 as a new consult at the request of Maurice Small, MD for the evaluation and management of pericardial effusion.  CV history: CT scan 08/05/19. Per read, "Heart is enlarged. Decreased attenuation of the intravascular compartment is indicative of anemia. Moderate pericardial effusion. Atherosclerotic calcification of the aorta, aortic valve and coronary arteries."  They were told they may need to have this drained.  Today: Here with his son today. Seen for elevated blood pressures. Reviewed mychart messages and home numbers. Here with son today. Wears out easily with activity or exertion. Has had good days and bad days, today is a bad day.   BP range 536-644 systolic, most 034-742. Most diastolic 59-56 mmHg.  Doesn't feel that heart rate elevates significantly with activity.   Reviewed results of stress test today as well.  Remains weak. Fatigue. Remains in wheelchair, needs help with ADLs.   Takes 1/2 tab carvedilol at night and 1 pill carvedilol in AM.  Swelling improved with compression stockings. Swelling L>R but improved.   Discussed restarting apixaban today. Last test is scheduled for 11/9. No indications thus far that any invasive testing needed. Will plan to restart apixaban if testing unremarkable and he is doing well.   Today: Seen for close follow up. Tapered off carvedilol and started on amlodipine 6 weeks ago. Reviewed log of Bps sent via mychart 11/2919, amlodipine increased to 10 mg. Heart rates improved off carvedilol.   Past Medical History:  Diagnosis  Date  . Hypertension   . SCC (squamous cell carcinoma) 08/02/2019   RIGHT FOREARM  (TX WITH BX)  . SCC (squamous cell carcinoma) 08/02/2019   LEFT FOREARM  (TX WITH BX)  . Squamous cell carcinoma of skin 08/08/2017   in situ-left forearm (txpbx)  . Squamous cell carcinoma of skin 01/09/2019   in situ-left forearm (txpbx)  . Squamous cell carcinoma of skin 01/09/2019   in situ-right hand proximal (txpbx)  . Squamous cell carcinoma of skin 01/09/2019   in situ-right arm (txpbx)    Past Surgical History:  Procedure Laterality Date  . APPENDECTOMY    . LIGAMENT REPAIR Right   . PROSTATECTOMY    . TOTAL HIP ARTHROPLASTY      Current Medications: Current Outpatient Medications on File Prior to Visit  Medication Sig  . allopurinol (ZYLOPRIM) 100 MG tablet Take 100 mg by mouth 2 (two) times daily.  Marland Kitchen amLODipine (NORVASC) 5 MG tablet Take 1 tablet (5 mg total) by mouth daily.  . chlorthalidone (HYGROTON) 25 MG tablet Take 0.5 tablets (12.5 mg total) by mouth daily.  . colchicine 0.6 MG tablet Take 0.6 mg by mouth as needed. For gout flare  . diclofenac Sodium (VOLTAREN) 1 % GEL daily as needed.   . dicyclomine (BENTYL) 20 MG tablet   . DICYCLOMINE HCL IM 20 mg daily. 4 tablets daily  . predniSONE (DELTASONE) 1 MG tablet   . predniSONE (DELTASONE) 10 MG tablet Take 7.5 mg by mouth 2 (two) times daily with a meal.   . predniSONE (  DELTASONE) 5 MG tablet    No current facility-administered medications on file prior to visit.     Allergies:   Patient has no known allergies.   Social History   Tobacco Use  . Smoking status: Never Smoker  . Smokeless tobacco: Never Used  Vaping Use  . Vaping Use: Never used  Substance Use Topics  . Alcohol use: Not Currently    Comment: occasional  . Drug use: Never    Family History: Denies family history of early cardiovascular disease  ROS:   Please see the history of present illness.  Additional pertinent ROS otherwise  unremarkable   EKGs/Labs/Other Studies Reviewed:    The following studies were reviewed today: Echo 10/07/19   Echo 02/09/18 Left ventricle: The cavity size was normal. Wall thickness was  increased in a pattern of moderate LVH. Systolic function was  normal. The estimated ejection fraction was in the range of 55%  to 60%. Doppler parameters are consistent with abnormal left  ventricular relaxation (grade 1 diastolic dysfunction).  - Aortic valve: Sclerosis without stenosis. Valve area (VTI): 1.5  cm^2. Valve area (Vmax): 1.74 cm^2. Valve area (Vmean): 1.71  cm^2.  - Mitral valve: Calcified annulus.  - Left atrium: The atrium was severely dilated.  - Atrial septum: No defect or patent foramen ovale was identified.  - Pericardium, extracardiac: Small pericardial effusion mostly over  the RV no tamponade.   EKG:  EKG is personally reviewed.  The ekg ordered 09/25/19 demonstrates atrial fibrillation, RBBB, HR 67  Recent Labs: 11/05/2019: ALT 14; BUN 39; Creatinine 1.88; Hemoglobin 10.4; Platelets 158; Potassium 4.5; Sodium 141  Recent Lipid Panel No results found for: CHOL, TRIG, HDL, CHOLHDL, VLDL, LDLCALC, LDLDIRECT  Physical Exam:    VS:  There were no vitals taken for this visit.    Wt Readings from Last 3 Encounters:  11/22/19 202 lb (91.6 kg)  11/20/19 202 lb (91.6 kg)  11/05/19 202 lb 6.4 oz (91.8 kg)    Speaking comfortably on the phone, no audible wheezing In no acute distress Alert and oriented Normal affect Normal speech  ASSESSMENT:    No diagnosis found. PLAN:    Pericardial effusion seen on CT: -echocardiogram without evidence of tamponade  Atrial fibrillation, chronic: -reports he is asymptomatic in afib -CHA2DS2/VAS Stroke Risk Points=3 -we discussed apixaban today. No plans for intervention on renal mass. They would like to do stress test, and if no cath needed then would restart apixaban. If Cr <1.5, will need 5 mg BID dosing, whereas  he will need 2.5 mg BID if Cr >1.5  Bilateral LE edema, dyspnea on exertion: -echo unremarkable, reviewed test results today -LE edema improved with compression stocking -discussed DOE as anginal equivalent. After extensive shared decision making, they wish to proceed with lexiscan myoview stress test  Plan for follow up: 3 mos or sooner based on results of testing  Today, I have spent 33 minutes with the patient with telehealth technology discussing the above problems.  Additional time spent in chart review, documentation, and communication.  Medication Adjustments/Labs and Tests Ordered: Current medicines are reviewed at length with the patient today.  Concerns regarding medicines are outlined above.  No orders of the defined types were placed in this encounter.  No orders of the defined types were placed in this encounter.   There are no Patient Instructions on file for this visit.  Signed, Buford Dresser, MD PhD 01/01/2020  [place]

## 2020-01-03 DIAGNOSIS — M6281 Muscle weakness (generalized): Secondary | ICD-10-CM | POA: Diagnosis not present

## 2020-01-04 DIAGNOSIS — M6281 Muscle weakness (generalized): Secondary | ICD-10-CM | POA: Diagnosis not present

## 2020-01-07 DIAGNOSIS — M6281 Muscle weakness (generalized): Secondary | ICD-10-CM | POA: Diagnosis not present

## 2020-01-09 DIAGNOSIS — M6281 Muscle weakness (generalized): Secondary | ICD-10-CM | POA: Diagnosis not present

## 2020-01-14 DIAGNOSIS — M6281 Muscle weakness (generalized): Secondary | ICD-10-CM | POA: Diagnosis not present

## 2020-01-16 DIAGNOSIS — M6281 Muscle weakness (generalized): Secondary | ICD-10-CM | POA: Diagnosis not present

## 2020-01-17 DIAGNOSIS — R300 Dysuria: Secondary | ICD-10-CM | POA: Diagnosis not present

## 2020-01-17 DIAGNOSIS — N390 Urinary tract infection, site not specified: Secondary | ICD-10-CM | POA: Diagnosis not present

## 2020-01-17 DIAGNOSIS — M6281 Muscle weakness (generalized): Secondary | ICD-10-CM | POA: Diagnosis not present

## 2020-01-18 DIAGNOSIS — R1310 Dysphagia, unspecified: Secondary | ICD-10-CM | POA: Diagnosis not present

## 2020-01-18 DIAGNOSIS — M6281 Muscle weakness (generalized): Secondary | ICD-10-CM | POA: Diagnosis not present

## 2020-01-20 DIAGNOSIS — R600 Localized edema: Secondary | ICD-10-CM | POA: Diagnosis not present

## 2020-01-20 DIAGNOSIS — F419 Anxiety disorder, unspecified: Secondary | ICD-10-CM | POA: Diagnosis not present

## 2020-01-20 DIAGNOSIS — N390 Urinary tract infection, site not specified: Secondary | ICD-10-CM | POA: Diagnosis not present

## 2020-01-20 DIAGNOSIS — D649 Anemia, unspecified: Secondary | ICD-10-CM | POA: Diagnosis not present

## 2020-01-20 DIAGNOSIS — R1310 Dysphagia, unspecified: Secondary | ICD-10-CM | POA: Diagnosis not present

## 2020-01-20 DIAGNOSIS — D469 Myelodysplastic syndrome, unspecified: Secondary | ICD-10-CM | POA: Diagnosis not present

## 2020-01-20 DIAGNOSIS — I10 Essential (primary) hypertension: Secondary | ICD-10-CM | POA: Diagnosis not present

## 2020-01-20 DIAGNOSIS — N189 Chronic kidney disease, unspecified: Secondary | ICD-10-CM | POA: Diagnosis not present

## 2020-01-20 DIAGNOSIS — M6281 Muscle weakness (generalized): Secondary | ICD-10-CM | POA: Diagnosis not present

## 2020-01-20 DIAGNOSIS — R262 Difficulty in walking, not elsewhere classified: Secondary | ICD-10-CM | POA: Diagnosis not present

## 2020-01-21 ENCOUNTER — Other Ambulatory Visit: Payer: Self-pay

## 2020-01-21 ENCOUNTER — Telehealth: Payer: Self-pay

## 2020-01-21 ENCOUNTER — Telehealth: Payer: Medicare Other | Admitting: Cardiology

## 2020-01-21 DIAGNOSIS — R1310 Dysphagia, unspecified: Secondary | ICD-10-CM | POA: Diagnosis not present

## 2020-01-21 DIAGNOSIS — D649 Anemia, unspecified: Secondary | ICD-10-CM | POA: Diagnosis not present

## 2020-01-21 DIAGNOSIS — E559 Vitamin D deficiency, unspecified: Secondary | ICD-10-CM | POA: Diagnosis not present

## 2020-01-21 DIAGNOSIS — M6281 Muscle weakness (generalized): Secondary | ICD-10-CM | POA: Diagnosis not present

## 2020-01-21 DIAGNOSIS — E119 Type 2 diabetes mellitus without complications: Secondary | ICD-10-CM | POA: Diagnosis not present

## 2020-01-21 NOTE — Telephone Encounter (Signed)
Called pt to initiate virtual visit and was informed by son pt is currently living in a facility. Son will have facility contact our office to reschedule appointment.

## 2020-01-23 DIAGNOSIS — M6281 Muscle weakness (generalized): Secondary | ICD-10-CM | POA: Diagnosis not present

## 2020-01-23 DIAGNOSIS — R1310 Dysphagia, unspecified: Secondary | ICD-10-CM | POA: Diagnosis not present

## 2020-01-24 DIAGNOSIS — M6281 Muscle weakness (generalized): Secondary | ICD-10-CM | POA: Diagnosis not present

## 2020-01-24 DIAGNOSIS — R1310 Dysphagia, unspecified: Secondary | ICD-10-CM | POA: Diagnosis not present

## 2020-01-25 DIAGNOSIS — R1310 Dysphagia, unspecified: Secondary | ICD-10-CM | POA: Diagnosis not present

## 2020-01-25 DIAGNOSIS — M6281 Muscle weakness (generalized): Secondary | ICD-10-CM | POA: Diagnosis not present

## 2020-01-27 DIAGNOSIS — D649 Anemia, unspecified: Secondary | ICD-10-CM | POA: Diagnosis not present

## 2020-01-28 DIAGNOSIS — R1310 Dysphagia, unspecified: Secondary | ICD-10-CM | POA: Diagnosis not present

## 2020-01-28 DIAGNOSIS — M6281 Muscle weakness (generalized): Secondary | ICD-10-CM | POA: Diagnosis not present

## 2020-01-29 ENCOUNTER — Encounter (HOSPITAL_COMMUNITY): Payer: Self-pay | Admitting: Family Medicine

## 2020-01-29 ENCOUNTER — Emergency Department (HOSPITAL_COMMUNITY): Payer: Medicare Other

## 2020-01-29 ENCOUNTER — Inpatient Hospital Stay (HOSPITAL_COMMUNITY)
Admission: EM | Admit: 2020-01-29 | Discharge: 2020-01-31 | DRG: 683 | Disposition: A | Discharge status: Skilled Nursing Facility | Payer: Medicare Other | Attending: Internal Medicine | Admitting: Internal Medicine

## 2020-01-29 DIAGNOSIS — N2889 Other specified disorders of kidney and ureter: Secondary | ICD-10-CM | POA: Diagnosis present

## 2020-01-29 DIAGNOSIS — R160 Hepatomegaly, not elsewhere classified: Secondary | ICD-10-CM | POA: Diagnosis not present

## 2020-01-29 DIAGNOSIS — C787 Secondary malignant neoplasm of liver and intrahepatic bile duct: Secondary | ICD-10-CM | POA: Diagnosis present

## 2020-01-29 DIAGNOSIS — R5381 Other malaise: Secondary | ICD-10-CM | POA: Diagnosis present

## 2020-01-29 DIAGNOSIS — I129 Hypertensive chronic kidney disease with stage 1 through stage 4 chronic kidney disease, or unspecified chronic kidney disease: Secondary | ICD-10-CM | POA: Diagnosis present

## 2020-01-29 DIAGNOSIS — Z515 Encounter for palliative care: Secondary | ICD-10-CM | POA: Diagnosis not present

## 2020-01-29 DIAGNOSIS — C799 Secondary malignant neoplasm of unspecified site: Secondary | ICD-10-CM

## 2020-01-29 DIAGNOSIS — N179 Acute kidney failure, unspecified: Principal | ICD-10-CM | POA: Diagnosis present

## 2020-01-29 DIAGNOSIS — M2550 Pain in unspecified joint: Secondary | ICD-10-CM | POA: Diagnosis not present

## 2020-01-29 DIAGNOSIS — D649 Anemia, unspecified: Secondary | ICD-10-CM | POA: Diagnosis not present

## 2020-01-29 DIAGNOSIS — Z85828 Personal history of other malignant neoplasm of skin: Secondary | ICD-10-CM | POA: Diagnosis not present

## 2020-01-29 DIAGNOSIS — I10 Essential (primary) hypertension: Secondary | ICD-10-CM | POA: Diagnosis present

## 2020-01-29 DIAGNOSIS — N1832 Chronic kidney disease, stage 3b: Secondary | ICD-10-CM | POA: Diagnosis present

## 2020-01-29 DIAGNOSIS — I4891 Unspecified atrial fibrillation: Secondary | ICD-10-CM

## 2020-01-29 DIAGNOSIS — K7689 Other specified diseases of liver: Secondary | ICD-10-CM | POA: Diagnosis not present

## 2020-01-29 DIAGNOSIS — N19 Unspecified kidney failure: Secondary | ICD-10-CM | POA: Diagnosis not present

## 2020-01-29 DIAGNOSIS — D469 Myelodysplastic syndrome, unspecified: Secondary | ICD-10-CM | POA: Diagnosis present

## 2020-01-29 DIAGNOSIS — N184 Chronic kidney disease, stage 4 (severe): Secondary | ICD-10-CM | POA: Diagnosis present

## 2020-01-29 DIAGNOSIS — N281 Cyst of kidney, acquired: Secondary | ICD-10-CM | POA: Diagnosis not present

## 2020-01-29 DIAGNOSIS — M1990 Unspecified osteoarthritis, unspecified site: Secondary | ICD-10-CM | POA: Diagnosis present

## 2020-01-29 DIAGNOSIS — Z20822 Contact with and (suspected) exposure to covid-19: Secondary | ICD-10-CM | POA: Diagnosis present

## 2020-01-29 DIAGNOSIS — D631 Anemia in chronic kidney disease: Secondary | ICD-10-CM | POA: Diagnosis present

## 2020-01-29 DIAGNOSIS — N261 Atrophy of kidney (terminal): Secondary | ICD-10-CM | POA: Diagnosis not present

## 2020-01-29 DIAGNOSIS — Z7401 Bed confinement status: Secondary | ICD-10-CM | POA: Diagnosis not present

## 2020-01-29 DIAGNOSIS — Z66 Do not resuscitate: Secondary | ICD-10-CM | POA: Diagnosis present

## 2020-01-29 DIAGNOSIS — I313 Pericardial effusion (noninflammatory): Secondary | ICD-10-CM | POA: Diagnosis present

## 2020-01-29 DIAGNOSIS — I3139 Other pericardial effusion (noninflammatory): Secondary | ICD-10-CM | POA: Diagnosis present

## 2020-01-29 DIAGNOSIS — R410 Disorientation, unspecified: Secondary | ICD-10-CM | POA: Diagnosis not present

## 2020-01-29 DIAGNOSIS — R69 Illness, unspecified: Secondary | ICD-10-CM | POA: Diagnosis not present

## 2020-01-29 LAB — COMPREHENSIVE METABOLIC PANEL
ALT: 17 U/L (ref 0–44)
AST: 27 U/L (ref 15–41)
Albumin: 2.6 g/dL — ABNORMAL LOW (ref 3.5–5.0)
Alkaline Phosphatase: 173 U/L — ABNORMAL HIGH (ref 38–126)
Anion gap: 11 (ref 5–15)
BUN: 66 mg/dL — ABNORMAL HIGH (ref 8–23)
CO2: 22 mmol/L (ref 22–32)
Calcium: 8.8 mg/dL — ABNORMAL LOW (ref 8.9–10.3)
Chloride: 106 mmol/L (ref 98–111)
Creatinine, Ser: 2.99 mg/dL — ABNORMAL HIGH (ref 0.61–1.24)
GFR, Estimated: 19 mL/min — ABNORMAL LOW (ref >60)
Glucose, Bld: 122 mg/dL — ABNORMAL HIGH (ref 70–99)
Potassium: 4.3 mmol/L (ref 3.5–5.1)
Sodium: 139 mmol/L (ref 135–145)
Total Bilirubin: 0.6 mg/dL (ref 0.3–1.2)
Total Protein: 5.9 g/dL — ABNORMAL LOW (ref 6.5–8.1)

## 2020-01-29 LAB — CBC WITH DIFFERENTIAL/PLATELET
Abs Immature Granulocytes: 0.94 10*3/uL — ABNORMAL HIGH (ref 0.00–0.07)
Basophils Absolute: 0 10*3/uL (ref 0.0–0.1)
Basophils Relative: 1 %
Eosinophils Absolute: 0.1 10*3/uL (ref 0.0–0.5)
Eosinophils Relative: 1 %
HCT: 29 % — ABNORMAL LOW (ref 39.0–52.0)
Hemoglobin: 9 g/dL — ABNORMAL LOW (ref 13.0–17.0)
Immature Granulocytes: 11 %
Lymphocytes Relative: 14 %
Lymphs Abs: 1.3 10*3/uL (ref 0.7–4.0)
MCH: 29.8 pg (ref 26.0–34.0)
MCHC: 31 g/dL (ref 30.0–36.0)
MCV: 96 fL (ref 80.0–100.0)
Monocytes Absolute: 0.5 10*3/uL (ref 0.1–1.0)
Monocytes Relative: 6 %
Neutro Abs: 6 10*3/uL (ref 1.7–7.7)
Neutrophils Relative %: 67 %
Platelets: 176 10*3/uL (ref 150–400)
RBC: 3.02 MIL/uL — ABNORMAL LOW (ref 4.22–5.81)
RDW: 17 % — ABNORMAL HIGH (ref 11.5–15.5)
WBC: 8.9 10*3/uL (ref 4.0–10.5)
nRBC: 0 % (ref 0.0–0.2)

## 2020-01-29 LAB — URINALYSIS, ROUTINE W REFLEX MICROSCOPIC
Bilirubin Urine: NEGATIVE
Glucose, UA: NEGATIVE mg/dL
Hgb urine dipstick: NEGATIVE
Ketones, ur: NEGATIVE mg/dL
Leukocytes,Ua: NEGATIVE
Nitrite: NEGATIVE
Protein, ur: NEGATIVE mg/dL
Specific Gravity, Urine: 1.015 (ref 1.005–1.030)
pH: 5 (ref 5.0–8.0)

## 2020-01-29 MED ORDER — APIXABAN 2.5 MG PO TABS
2.5000 mg | ORAL_TABLET | Freq: Two times a day (BID) | ORAL | Status: DC
  Administered 2020-01-29: 2.5 mg via ORAL
  Filled 2020-01-29 (×2): qty 1

## 2020-01-29 MED ORDER — PREDNISONE 5 MG PO TABS
5.0000 mg | ORAL_TABLET | Freq: Every day | ORAL | Status: DC
  Administered 2020-01-30 – 2020-01-31 (×2): 5 mg via ORAL
  Filled 2020-01-29 (×3): qty 1

## 2020-01-29 MED ORDER — PREDNISONE 1 MG PO TABS
2.0000 mg | ORAL_TABLET | Freq: Every day | ORAL | Status: DC
  Administered 2020-01-30 – 2020-01-31 (×2): 2 mg via ORAL
  Filled 2020-01-29 (×3): qty 2

## 2020-01-29 MED ORDER — ALLOPURINOL 100 MG PO TABS
200.0000 mg | ORAL_TABLET | Freq: Every day | ORAL | Status: DC
  Administered 2020-01-30 – 2020-01-31 (×2): 200 mg via ORAL
  Filled 2020-01-29 (×3): qty 2

## 2020-01-29 MED ORDER — ACETAMINOPHEN 325 MG PO TABS: 650.0000 mg | ORAL_TABLET | Freq: Four times a day (QID) | ORAL | Status: DC | PRN

## 2020-01-29 MED ORDER — ONDANSETRON HCL 4 MG/2ML IJ SOLN: 4.0000 mg | Freq: Four times a day (QID) | INTRAMUSCULAR | Status: DC | PRN

## 2020-01-29 MED ORDER — SODIUM CHLORIDE 0.9 % IV SOLN: INTRAVENOUS | Status: AC

## 2020-01-29 MED ORDER — ACETAMINOPHEN 650 MG RE SUPP: 650.0000 mg | Freq: Four times a day (QID) | RECTAL | Status: DC | PRN

## 2020-01-29 MED ORDER — PANTOPRAZOLE SODIUM 40 MG PO TBEC
40.0000 mg | DELAYED_RELEASE_TABLET | Freq: Every day | ORAL | Status: DC
  Administered 2020-01-30 – 2020-01-31 (×2): 40 mg via ORAL
  Filled 2020-01-29 (×2): qty 1

## 2020-01-29 MED ORDER — SODIUM CHLORIDE 0.9 % IV BOLUS
1000.0000 mL | Freq: Once | INTRAVENOUS | Status: AC
  Administered 2020-01-29: 1000 mL via INTRAVENOUS

## 2020-01-29 MED ORDER — CARVEDILOL 6.25 MG PO TABS
18.7500 mg | ORAL_TABLET | Freq: Every day | ORAL | Status: DC
  Administered 2020-01-30 – 2020-01-31 (×2): 18.75 mg via ORAL
  Filled 2020-01-29 (×2): qty 1

## 2020-01-29 MED ORDER — BUSPIRONE HCL 5 MG PO TABS
7.5000 mg | ORAL_TABLET | Freq: Two times a day (BID) | ORAL | Status: DC
  Administered 2020-01-29 – 2020-01-31 (×4): 7.5 mg via ORAL
  Filled 2020-01-29 (×2): qty 2
  Filled 2020-01-29: qty 1.5
  Filled 2020-01-29: qty 2
  Filled 2020-01-29: qty 1.5

## 2020-01-29 MED ORDER — ONDANSETRON HCL 4 MG PO TABS: 4.0000 mg | ORAL_TABLET | Freq: Four times a day (QID) | ORAL | Status: DC | PRN

## 2020-01-29 MED ORDER — SENNOSIDES-DOCUSATE SODIUM 8.6-50 MG PO TABS: 1.0000 | ORAL_TABLET | Freq: Every evening | ORAL | Status: DC | PRN

## 2020-01-29 NOTE — ED Triage Notes (Signed)
Joseph Leach transported pt from [facility] and reports the following:  Provider at [facility] has been treating pt for UTI for a couple weeks. Stopped oral abx five days ago. Staff reports pt is AOx4 at baseline, but they think his mental status is declining and he is weak. Son, [place], should arrive soon.

## 2020-01-29 NOTE — H&P (Signed)
History and Physical    Joseph Leach NFA:[id number] DOB: January 19, 1925 DOA: 01/29/2020  PCP: Maurice Small, MD   Patient coming from: ALF  Chief Complaint: Worsening renal function, fatigue   HPI: Joseph Leach is a 87 y.o. male with medical history significant for atrial fibrillation on Eliquis, chronic kidney disease stage, hypertension, squamous cell carcinoma, right renal mass, and lung nodules, who presented to the emergency department for evaluation of worsening renal function and fatigue.  History was obtained from the patient's son, chart review, and report of nursing facility personnel.  Patient has been at an ALF for 6 weeks after his son was no longer able to care for the patient due to increasing weakness and difficulty ambulating on his own.  Over the past 6 weeks, patient has continued to progress in terms of fatigue and general weakness.  His renal function has been monitored while at the facility and has been worsening despite receiving a liter of IV fluids recently.  Patient has no specific complaints and denies any pain or fevers at this time but notes that his appetite has been poor.  His son confirms that the patient has been eating and drinking less despite encouragement from his spouse who is also at the ALF.  Per report of the patient's son, patient saw urology about a right renal mass and was told that it was not malignant.  He also saw pulmonology recently for lung nodules and was under the impression that these were not cancerous either.  He has been following with cardiology and had an echocardiogram a couple months ago to evaluate a pericardial effusion.  Patient was recently treated for urinary tract infection.  ED Course: Upon arrival to the ED, patient is found to be afebrile, saturating well on room air, and with stable blood pressure.  Chemistry panel was notable for BUN of 66 and creatinine 2.99, up from 1.88 in October.  CBC features a normocytic anemia with  hemoglobin 9.0, down from 10.4 in October.  CT stone study is concerning for numerous new hypodense liver lesions and new bibasilar lung nodules suspicious for metastatic disease.  Large pericardial and small pleural effusions appear unchanged on the CT.  Patient was given a liter of saline in the ED.  COVID-19 screening test is pending.  Review of Systems:  Unable to complete ROS secondary the patient's clinical condition.  Past Medical History:  Diagnosis Date  . Hypertension   . SCC (squamous cell carcinoma) 08/02/2019   RIGHT FOREARM  (TX WITH BX)  . SCC (squamous cell carcinoma) 08/02/2019   LEFT FOREARM  (TX WITH BX)  . Squamous cell carcinoma of skin 08/08/2017   in situ-left forearm (txpbx)  . Squamous cell carcinoma of skin 01/09/2019   in situ-left forearm (txpbx)  . Squamous cell carcinoma of skin 01/09/2019   in situ-right hand proximal (txpbx)  . Squamous cell carcinoma of skin 01/09/2019   in situ-right arm (txpbx)    Past Surgical History:  Procedure Laterality Date  . APPENDECTOMY    . LIGAMENT REPAIR Right   . PROSTATECTOMY    . TOTAL HIP ARTHROPLASTY      Social History:   reports that he has never smoked. He has never used smokeless tobacco. He reports previous alcohol use. He reports that he does not use drugs.  No Known Allergies  History reviewed. No pertinent family history.   Prior to Admission medications   Medication Sig Start Date End Date Taking? Authorizing Provider  allopurinol (ZYLOPRIM) 100 MG tablet Take 200 mg by mouth daily.   Yes [provider]  apixaban (ELIQUIS) 2.5 MG TABS tablet Take 2.5 mg by mouth 2 (two) times daily.   Yes [provider]  busPIRone (BUSPAR) 7.5 MG tablet Take 7.5 mg by mouth 2 (two) times daily.   Yes [provider]  carvedilol (COREG) 12.5 MG tablet Take 18.75 mg by mouth daily.   Yes [provider]  chlorthalidone (HYGROTON) 25 MG tablet Take 0.5 tablets (12.5 mg total) by  mouth daily. 10/03/19  Yes Buford Dresser, MD  cholecalciferol (VITAMIN D3) 25 MCG (1000 UNIT) tablet Take 1,000 Units by mouth daily.   Yes [provider]  colchicine 0.6 MG tablet Take 0.6 mg by mouth daily as needed (gout flare up). For gout flare   Yes [provider]  dicyclomine (BENTYL) 20 MG tablet Take 20 mg by mouth in the morning, at noon, in the evening, and at bedtime. 11/04/19  Yes [provider]  docusate sodium (COLACE) 100 MG capsule Take 100 mg by mouth daily.   Yes [provider]  pantoprazole (PROTONIX) 40 MG tablet Take 40 mg by mouth daily.   Yes [provider]  phenylephrine-shark liver oil-mineral oil-petrolatum (PREPARATION H) 0.25-14-74.9 % rectal ointment Place 1 application rectally every 6 (six) hours as needed for hemorrhoids.   Yes [provider]  polyethylene glycol (MIRALAX / GLYCOLAX) 17 g packet Take 17 g by mouth daily as needed for moderate constipation.   Yes [provider]  predniSONE (DELTASONE) 1 MG tablet Take 2 mg by mouth daily. 11/07/19  Yes [provider]  predniSONE (DELTASONE) 5 MG tablet Take 5 mg by mouth daily. Take with two 1 mg tablets to equal 7 mg 11/06/19  Yes [provider]  vitamin B-12 (CYANOCOBALAMIN) 1000 MCG tablet Take 1,000 mcg by mouth daily.   Yes [provider]  zinc oxide 20 % ointment Apply 1 application topically daily. Apply small amount to buttocks   Yes [provider]  amLODipine (NORVASC) 5 MG tablet Take 1 tablet (5 mg total) by mouth daily. Patient not taking: Reported on 01/29/2020 11/22/19 02/20/20  Buford Dresser, MD    Physical Exam: Vitals:   01/29/20 1900 01/29/20 1952 01/29/20 2015 01/29/20 2045  BP: 123/66 122/62 117/72 135/78  Pulse: 72 62 61 81  Resp:   16 16  Temp:  98.1 F (36.7 C)    TempSrc:  Oral    SpO2: 97% 97% 96% 96%    Constitutional: NAD, calm  Eyes: PERTLA, lids and  conjunctivae normal ENMT: Mucous membranes are moist. Posterior pharynx clear of any exudate or lesions.   Neck: normal, supple, no masses, no thyromegaly Respiratory: no wheezing, no crackles. No accessory muscle use.  Cardiovascular: S1 & S2 heard. No extremity edema.   Abdomen: No distension, no tenderness, soft. Bowel sounds active.  Musculoskeletal: no clubbing / cyanosis. No joint deformity upper and lower extremities.   Skin: no significant rashes, lesions, ulcers. Warm, dry, well-perfused. Neurologic: CN 2-12 grossly intact. Sensation intact. Moving all extremities.  Psychiatric: Alert. Oriented to person and place only. Very pleasant and cooperative.    Labs and Imaging on Admission: I have personally reviewed following labs and imaging studies  CBC: Recent Labs  Lab 01/29/20 1951  WBC 8.9  NEUTROABS 6.0  HGB 9.0*  HCT 29.0*  MCV 96.0  PLT 347   Basic Metabolic Panel: Recent Labs  Lab 01/29/20  1951  NA 139  K 4.3  CL 106  CO2 22  GLUCOSE 122*  BUN 66*  CREATININE 2.99*  CALCIUM 8.8*   GFR: CrCl cannot be calculated (Unknown ideal weight.). Liver Function Tests: Recent Labs  Lab 01/29/20 1951  AST 27  ALT 17  ALKPHOS 173*  BILITOT 0.6  PROT 5.9*  ALBUMIN 2.6*   No results for input(s): LIPASE, AMYLASE in the last 168 hours. No results for input(s): AMMONIA in the last 168 hours. Coagulation Profile: No results for input(s): INR, PROTIME in the last 168 hours. Cardiac Enzymes: No results for input(s): CKTOTAL, CKMB, CKMBINDEX, TROPONINI in the last 168 hours. BNP (last 3 results) No results for input(s): PROBNP in the last 8760 hours. HbA1C: No results for input(s): HGBA1C in the last 72 hours. CBG: No results for input(s): GLUCAP in the last 168 hours. Lipid Profile: No results for input(s): CHOL, HDL, LDLCALC, TRIG, CHOLHDL, LDLDIRECT in the last 72 hours. Thyroid Function Tests: No results for input(s): TSH, T4TOTAL, FREET4, T3FREE,  THYROIDAB in the last 72 hours. Anemia Panel: No results for input(s): VITAMINB12, FOLATE, FERRITIN, TIBC, IRON, RETICCTPCT in the last 72 hours. Urine analysis:    Component Value Date/Time   COLORURINE YELLOW 01/29/2020 2120   APPEARANCEUR CLEAR 01/29/2020 2120   LABSPEC 1.015 01/29/2020 2120   PHURINE 5.0 01/29/2020 2120   GLUCOSEU NEGATIVE 01/29/2020 2120   HGBUR NEGATIVE 01/29/2020 2120   [place] 01/29/2020 2120   KETONESUR NEGATIVE 01/29/2020 2120   PROTEINUR NEGATIVE 01/29/2020 2120   NITRITE NEGATIVE 01/29/2020 2120   LEUKOCYTESUR NEGATIVE 01/29/2020 2120   Sepsis Labs: @LABRCNTIP (procalcitonin:4,lacticidven:4) )No results found for this or any previous visit (from the past 240 hour(s)).   Radiological Exams on Admission: CT Renal Stone Study  Result Date: 01/29/2020 CLINICAL DATA:  Renal failure.  Suspected UTI. EXAM: CT ABDOMEN AND PELVIS WITHOUT CONTRAST TECHNIQUE: Multidetector CT imaging of the abdomen and pelvis was performed following the standard protocol without IV contrast. COMPARISON:  08/05/2019 FINDINGS: LOWER CHEST: There are multiple bibasilar nodular opacities that are new. In the right lower lobe there is a 1.8 cm nodule (9:88). In the lingula there is a 2.3 cm nodule (4:26) and a 1.6 cm nodule (4:21). In the left lower lobe there are 2 small nodules that measure 5-6 mm (4:25 and 4:31). There are small pleural effusions with basilar atelectasis. Large pericardial effusion is unchanged. HEPATOBILIARY: There are numerous hypodense masses throughout the liver, new. The largest masses in the right hepatic lobe measures 6.6 cm. The gallbladder is normal. PANCREAS: Normal pancreas. No ductal dilatation or peripancreatic fluid collection. SPLEEN: Normal. ADRENALS/URINARY TRACT: The adrenal glands are normal. Mass of right renal cyst measures 22 x 22 cm, unchanged. Areas of hyperdensity within the cyst on the prior study are no longer visible. The left kidney is  atrophic with an unchanged upper pole cyst measuring 5.4 cm. The urinary bladder is normal for degree of distention STOMACH/BOWEL: There is no hiatal hernia. Normal duodenal course and caliber. No small bowel dilatation or inflammation. No focal colonic abnormality. Status post appendectomy. VASCULAR/LYMPHATIC: There is calcific aortic atherosclerosis. There is a cystic focus noted in the right anterior pelvis, unchanged (2:87). There is no retroperitoneal or pelvic sidewall lymphadenopathy. REPRODUCTIVE: Obscured by streak artifact from right hip hardware and possible brachy therapy seeds. MUSCULOSKELETAL. Right hip arthroplasty. Multilevel degenerative change. No lytic or blastic lesions. OTHER: There is a new subcutaneous soft tissue nodule at the right lower quadrant (2:71). This  measures 2.3 cm. IMPRESSION: 1. Numerous new hypodense masses throughout the liver and multiple new bibasilar nodular opacities, consistent with metastatic disease. 2. New subcutaneous soft tissue nodule at the right lower quadrant, concerning for metastatic disease. 3. Large pericardial effusion and small pleural effusions. Aortic Atherosclerosis (ICD10-I70.0). Electronically Signed   By: Ulyses Jarred M.D.   On: 01/29/2020 21:55    Assessment/Plan   1. Acute kidney injury superimposed on CKD IIIb  - Presents with several weeks progressive fatigue, poor appetite, and worsening renal function despite IVF  - SCr is 2.99, up from 1.88 in October  - No hydronephrosis seen on CT in ED  - Hold chlorthalidone, continue IVF hydration, renally-dose medications, repeat chem panel   2. Metastatic disease  - CT abd/pelvis notable for new numerous hypodense liver lesions, lung nodules, and subcutaneous soft tissue nodule concerning for metastatic disease  - Per patient's son, urology determined that the right renal mass was not cancerous  - Routine oncology consultation requested   3. Atrial fibrillation  - CHADS-VASc at least (age  x2, HTN), continue Eliquis and beta-blocker    4. Pericardial effusion  - Large pericardial effusion appears unchanged on CT in ED, followed by cardiology     5. Arthritis  - Follows with rheumatology and managed with prednisone   6. Anemia  - Hgb is 9.0, down from 10.4 in October  - No obvious bleeding, FOBT pending     DVT prophylaxis: Eliquis  Code Status: DNR, dicussed with son   Family Communication: Son updated by phone   Disposition Plan:  Patient is from: ALF Anticipated d/c is to: TBD Anticipated d/c date is: 02/01/20 Patient currently: Pending improvement in renal function, therapy assessment  Consults called: None  Admission status: Inpatient     Vianne Bulls, MD [facility]  01/29/2020, 11:00 PM

## 2020-01-29 NOTE — ED Provider Notes (Incomplete)
[facility] DEPT Provider Note   CSN: [id number] Arrival date & time: 01/29/20  1754     History Chief Complaint  Patient presents with  . Urinary Tract Infection    Joseph Leach is a 87 y.o. male.  He has a history of squamous cell cancer, hypertension, myelodysplastic syndrome.  He is currently at a facility.  They have noticed his renal function worsening.  He is complaining of some stinging when he urinates.  General fatigue.  Reportedly was treated for a urinary tract infection recently.  He denies any cough or shortness of breath.  No abdominal pain.  No hematuria.  No fevers or chills.  The history is provided by the patient.  Urinary Tract Infection Presenting symptoms: penile pain   Presenting symptoms: no dysuria   Context: after urination   Relieved by:  None tried Worsened by:  Urination Ineffective treatments:  None tried Associated symptoms: no abdominal pain, no fever, no genital lesions, no hematuria and no vomiting   Risk factors: recent infection   Risk factors: no urinary catheter   Weakness Severity:  Moderate Onset quality:  Gradual Duration: months. Timing:  Intermittent Progression:  Worsening Chronicity:  New Context: urinary tract infection   Relieved by:  Nothing Worsened by:  Activity Ineffective treatments:  None tried Associated symptoms: no abdominal pain, no chest pain, no dysuria, no fever, no headaches, no shortness of breath and no vomiting        Past Medical History:  Diagnosis Date  . Hypertension   . SCC (squamous cell carcinoma) 08/02/2019   RIGHT FOREARM  (TX WITH BX)  . SCC (squamous cell carcinoma) 08/02/2019   LEFT FOREARM  (TX WITH BX)  . Squamous cell carcinoma of skin 08/08/2017   in situ-left forearm (txpbx)  . Squamous cell carcinoma of skin 01/09/2019   in situ-left forearm (txpbx)  . Squamous cell carcinoma of skin 01/09/2019   in situ-right hand proximal (txpbx)  . Squamous  cell carcinoma of skin 01/09/2019   in situ-right arm (txpbx)    Patient Active Problem List   Diagnosis Date Noted  . Iron deficiency anemia 11/08/2019    Past Surgical History:  Procedure Laterality Date  . APPENDECTOMY    . LIGAMENT REPAIR Right   . PROSTATECTOMY    . TOTAL HIP ARTHROPLASTY         No family history on file.  Social History   Tobacco Use  . Smoking status: Never Smoker  . Smokeless tobacco: Never Used  Vaping Use  . Vaping Use: Never used  Substance Use Topics  . Alcohol use: Not Currently    Comment: occasional  . Drug use: Never    Home Medications Prior to Admission medications   Medication Sig Start Date End Date Taking? Authorizing Provider  allopurinol (ZYLOPRIM) 100 MG tablet Take 100 mg by mouth 2 (two) times daily.    [provider]  amLODipine (NORVASC) 5 MG tablet Take 1 tablet (5 mg total) by mouth daily. 11/22/19 02/20/20  Buford Dresser, MD  chlorthalidone (HYGROTON) 25 MG tablet Take 0.5 tablets (12.5 mg total) by mouth daily. 10/03/19   Buford Dresser, MD  colchicine 0.6 MG tablet Take 0.6 mg by mouth as needed. For gout flare    [provider]  diclofenac Sodium (VOLTAREN) 1 % GEL daily as needed.  06/03/19   [provider]  dicyclomine (BENTYL) 20 MG tablet  11/04/19   [provider]  DICYCLOMINE HCL  IM 20 mg daily. 4 tablets daily 01/17/17   [provider]  predniSONE (DELTASONE) 1 MG tablet  11/07/19   [provider]  predniSONE (DELTASONE) 10 MG tablet Take 7.5 mg by mouth 2 (two) times daily with a meal.     [provider]  predniSONE (DELTASONE) 5 MG tablet  11/06/19   [provider]    Allergies    Patient has no known allergies.  Review of Systems   Review of Systems  Constitutional: Positive for fatigue. Negative for fever.  HENT: Negative for sore throat.   Eyes: Negative for visual disturbance.  Respiratory: Negative for  shortness of breath.   Cardiovascular: Negative for chest pain.  Gastrointestinal: Negative for abdominal pain and vomiting.  Genitourinary: Positive for penile pain. Negative for dysuria and hematuria.  Musculoskeletal: Negative for neck pain.  Skin: Negative for rash.  Neurological: Positive for weakness. Negative for headaches.    Physical Exam Updated Vital Signs BP 123/71   Pulse 75   Temp 97.6 F (36.4 C) (Oral)   SpO2 98%   Physical Exam Vitals and nursing note reviewed.  Constitutional:      Appearance: Normal appearance. He is well-developed and well-nourished.  HENT:     Head: Normocephalic and atraumatic.  Eyes:     Conjunctiva/sclera: Conjunctivae normal.  Cardiovascular:     Rate and Rhythm: Normal rate and regular rhythm.     Heart sounds: No murmur heard.   Pulmonary:     Effort: Pulmonary effort is normal. No respiratory distress.     Breath sounds: Normal breath sounds.  Abdominal:     Palpations: Abdomen is soft. There is mass (small soft tissue mass r mid abd).     Tenderness: There is no abdominal tenderness. There is no guarding or rebound.  Musculoskeletal:        General: No deformity, signs of injury or edema. Normal range of motion.     Cervical back: Neck supple.  Skin:    General: Skin is warm and dry.     Capillary Refill: Capillary refill takes less than 2 seconds.  Neurological:     General: No focal deficit present.     Mental Status: He is alert.  Psychiatric:        Mood and Affect: Mood and affect normal.     ED Results / Procedures / Treatments   Labs (all labs ordered are listed, but only abnormal results are displayed) Labs Reviewed  URINE CULTURE  COMPREHENSIVE METABOLIC PANEL  CBC WITH DIFFERENTIAL/PLATELET  URINALYSIS, ROUTINE W REFLEX MICROSCOPIC    EKG None  Radiology No results found.  Procedures Procedures (including critical care time)  Medications Ordered in ED Medications  sodium chloride 0.9 % bolus  1,000 mL (has no administration in time range)    ED Course  I have reviewed the triage vital signs and the nursing notes.  Pertinent labs & imaging results that were available during my care of the patient were reviewed by me and considered in my medical decision making (see chart for details).  Clinical Course as of 01/30/20 1027  Wed Jan 29, 2020  2215 Discussed with the patient's son. He said he had imaging done in the summer that showed some kidney cysts and he had a biopsy but didn't have any liver disease. He is healthcare power of attorney. He said his father would not want any life prolonging procedures. [MB]    Clinical Course User Index [MB] Aletta Edouard  C, MD   MDM Rules/Calculators/A&P                         This patient complains of generalized weakness, worsening renal function, dysuria; this involves an extensive number of treatment Options and is a complaint that carries with it a high risk of complications and Morbidity. The differential includes UTI, kidney stone, metabolic derangement, anemia, GI bleed infection, COVID  Joseph Leach was evaluated in Emergency Department on 01/30/2020 for the symptoms described in the history of present illness. He was evaluated in the context of the global COVID-19 pandemic, which necessitated consideration that the patient might be at risk for infection with the SARS-CoV-2 virus that causes COVID-19. Institutional protocols and algorithms that pertain to the evaluation of patients at risk for COVID-19 are in a state of rapid change based on information released by regulatory bodies including the CDC and federal and state organizations. These policies and algorithms were followed during the patient's care in the ED.   I ordered, reviewed and interpreted labs, which included CBC with normal white count, hemoglobin lower than baseline, chemistries with slightly low potassium, elevated BUN/creatinine trending worse from prior, TSH  normal, urinalysis without signs of infection, COVID testing negative I ordered medication IV fluids I ordered imaging studies which included CT renal and I independently    visualized and interpreted imaging which showed multiple new lesions in the liver consistent with metastatic disease Additional history obtained from patient's son Previous records obtained and reviewed in epic including nurse practitioner notes from patient's facility I consulted [facility] hospitalist Dr. Myna Hidalgo and discussed lab and imaging findings  Critical Interventions: None  After the interventions stated above, I reevaluated the patient and found patient to be minimally symptomatic and hemodynamically stable. He is agreeable to admission for further work-up.  CHA2DS2/VAS Stroke Risk Points  Current as of 16 minutes ago     3 >= 2 Points: High Risk  1 - 1.99 Points: Medium Risk  0 Points: Low Risk    No Change      Details    This score determines the patient's risk of having a stroke if the  patient has atrial fibrillation.       Points Metrics  0 Has Congestive Heart Failure:  No    Current as of 16 minutes ago  0 Has Vascular Disease:  No    Current as of 16 minutes ago  1 Has Hypertension:  Yes    Current as of 16 minutes ago  2 Age:  82    Current as of 16 minutes ago  0 Has Diabetes:  No    Current as of 16 minutes ago  0 Had Stroke:  No  Had TIA:  No  Had Thromboembolism:  No    Current as of 16 minutes ago  0 Male:  No    Current as of 16 minutes ago           Final Clinical Impression(s) / ED Diagnoses Final diagnoses:  AKI (acute kidney injury) ([place])  Liver masses    Rx / DC Orders ED Discharge Orders    None       Hayden Rasmussen, MD 01/30/20 1033    Hayden Rasmussen, MD 01/30/20 [phone]

## 2020-01-29 NOTE — ED Notes (Signed)
In and out output 100 mL

## 2020-01-30 ENCOUNTER — Telehealth: Payer: Self-pay | Admitting: Hematology

## 2020-01-30 DIAGNOSIS — I10 Essential (primary) hypertension: Secondary | ICD-10-CM

## 2020-01-30 DIAGNOSIS — C799 Secondary malignant neoplasm of unspecified site: Secondary | ICD-10-CM | POA: Diagnosis not present

## 2020-01-30 DIAGNOSIS — N179 Acute kidney failure, unspecified: Secondary | ICD-10-CM | POA: Diagnosis not present

## 2020-01-30 DIAGNOSIS — D649 Anemia, unspecified: Secondary | ICD-10-CM | POA: Diagnosis not present

## 2020-01-30 LAB — BASIC METABOLIC PANEL
Anion gap: 11 (ref 5–15)
BUN: 58 mg/dL — ABNORMAL HIGH (ref 8–23)
CO2: 22 mmol/L (ref 22–32)
Calcium: 8.5 mg/dL — ABNORMAL LOW (ref 8.9–10.3)
Chloride: 107 mmol/L (ref 98–111)
Creatinine, Ser: 2.63 mg/dL — ABNORMAL HIGH (ref 0.61–1.24)
GFR, Estimated: 22 mL/min — ABNORMAL LOW (ref >60)
Glucose, Bld: 98 mg/dL (ref 70–99)
Potassium: 3.4 mmol/L — ABNORMAL LOW (ref 3.5–5.1)
Sodium: 140 mmol/L (ref 135–145)

## 2020-01-30 LAB — CBC
HCT: 28.4 % — ABNORMAL LOW (ref 39.0–52.0)
Hemoglobin: 8.5 g/dL — ABNORMAL LOW (ref 13.0–17.0)
MCH: 28.8 pg (ref 26.0–34.0)
MCHC: 29.9 g/dL — ABNORMAL LOW (ref 30.0–36.0)
MCV: 96.3 fL (ref 80.0–100.0)
Platelets: 199 10*3/uL (ref 150–400)
RBC: 2.95 MIL/uL — ABNORMAL LOW (ref 4.22–5.81)
RDW: 16.8 % — ABNORMAL HIGH (ref 11.5–15.5)
WBC: 9.3 10*3/uL (ref 4.0–10.5)
nRBC: 0 % (ref 0.0–0.2)

## 2020-01-30 LAB — TSH: TSH: 2.085 u[IU]/mL (ref 0.350–4.500)

## 2020-01-30 LAB — SARS CORONAVIRUS 2 (TAT 6-24 HRS): SARS Coronavirus 2: NEGATIVE

## 2020-01-30 LAB — AMMONIA: Ammonia: 16 umol/L (ref 9–35)

## 2020-01-30 NOTE — Telephone Encounter (Signed)
Called pt per 11/3 sch msg - no answer. Left message for patient to call back to reschedule appt.   

## 2020-01-30 NOTE — ED Notes (Signed)
Family at bedside. 

## 2020-01-30 NOTE — Progress Notes (Addendum)
PROGRESS NOTE  Joseph Leach QJJ:[id number] DOB: 1925/08/16 DOA: 01/29/2020 PCP: Maurice Small, MD   LOS: 1 day   Brief narrative:  Joseph Leach is a 87 y.o. male with medical history significant for atrial fibrillation on Eliquis, chronic kidney disease, hypertension, squamous cell carcinoma of skin, right renal mass, and lung nodules, who presented to the emergency department for evaluation of worsening renal function and fatigue.  Patient has been at an ALF for 6 weeks after his son was no longer able to care for the patient due to increasing weakness and difficulty ambulating on his own.  Over the past 6 weeks, patient has continued to progress in terms of fatigue and general weakness.  His renal function has been monitored while at the facility and has been worsening despite receiving a liter of IV fluids recently.   Per report of the patient's son, patient saw urology about a right renal mass and was told that it was not malignant.  He also saw pulmonology recently for lung nodules and was under the impression that these were not cancerous either.  He has been following with cardiology and had an echocardiogram a couple months ago to evaluate a pericardial effusion.  Patient was recently treated for urinary tract infection. In the ED, patient was found to be afebrile, saturating well on room air, and with stable blood pressure.  Chemistry panel was notable for BUN of 66 and creatinine 2.99, up from 1.88 in October.  CBC  Showed normocytic anemia with hemoglobin 9.0, down from 10.4 in October.  CT stone study was concerning for numerous new hypodense liver lesions and new bibasilar lung nodules suspicious for metastatic disease.  Large pericardial and small pleural effusions appeared unchanged on the CT.  Patient was given a liter of saline in the ED.  COVID-19 was negative. Patient was then admitted to hospital for further evaluation and treatment.  Assessment/Plan:  Principal Problem:    Acute renal failure superimposed on stage 3b chronic kidney disease (HCC) Active Problems:   Metastatic disease (HCC)   Normocytic anemia   Pericardial effusion   Right renal mass   Unspecified atrial fibrillation (HCC)   Essential hypertension  Acute kidney injury  on CKD IIIb  Presented with fatigue, poor appetite, and worsening renal function despite IV fluid.  SCr 2.99 on presentation, up from 1.88 in October.  CT scan of the abdomen without hydronephrosis.  Was on chlorthalidone will be discontinued.  Continue  IVF hydration, continue to renally-dose medications, closely monitor BMP.  Renal mass, multiple hypodense lesions in the liver, pulmonary nodules, skeletal lesions.  - High possibility of metastatic disease.  Although patient had seen urology recently and renal mass was read to be negative and pulmonary nodules were thought to be benign as well.  Oncology was notified by the admitting attending. If this is cancer, patient has poor prognosis. Will get palliative care consultation.  Spoke about potential cancer diagnosis and prognosis.  Patient's son is not in favor of doing a biopsy at this time and would like to discuss with the rest of the family  Atrial fibrillation  - CHADS-VASc at least (age x2, HTN), on Eliquis and beta-blocker .  We will hold Eliquis for now in case there would be plan for biopsy  History of pericardial effusion  - Large pericardial effusion appears unchanged on CT in ED, followed by cardiology   as outpatient.  Arthritis  - Follows up with rheumatology and managed with prednisone  Normocytic anemia  - Hgb is 9.0, down from 10.4 in October.  No evidence of bleeding.  Secondary to CKD.  Debility, deconditioning. Currently at ALF for 6 weeks. General health has deteriorated since May as per the son.   DVT prophylaxis: apixaban (ELIQUIS) tablet 2.5 mg Start: 02-28-2020 2300 apixaban (ELIQUIS) tablet 2.5 mg , Will hold for now.   Code Status:  DNR  Family Communication: I spoke with the patient's son and updated him about the clinical condition of the patient, the son wishes to focus on comfort, wants to talk to assisted living facility and his mother to make a final decision.  Status is: Inpatient  Remains inpatient appropriate because:IV treatments appropriate due to intensity of illness or inability to take PO, Inpatient level of care appropriate due to severity of illness and New liver lesions  Dispo: The patient is from: ALF              Anticipated d/c is to: ALF/SNF, possibly with hospice care              Anticipated d/c date is: 2 days              Patient currently is not medically stable to d/c.  Consultants:  Oncology  Procedures:  None  Anti-infectives: . None  Anti-infectives (From admission, onward)   None     Subjective: Today, patient was seen and examined at bedside. No fever, chills, abdominal pain, nausea vomiting, shortness of breath. Appears confused.  Objective: Vitals:   01/30/20 0645 01/30/20 0800  BP: 126/80 112/65  Pulse: 85 67  Resp:  (!) 24  Temp:    SpO2: 96% 98%   No intake or output data in the 24 hours ending 01/30/20 0824 There were no vitals filed for this visit. There is no height or weight on file to calculate BMI.   Physical Exam:  GENERAL: Patient is alert awake and communicative, confused. Not in obvious distress. HENT: Pallor+. Pupils equally reactive to light. Oral mucosa is dry NECK: is supple, no gross swelling noted. CHEST: Clear to auscultation. No crackles or wheezes.  Diminished breath sounds bilaterally. CVS: S1 and S2 heard, no murmur. Regular rate and rhythm.  ABDOMEN: Soft, non-tender, bowel sounds are present. EXTREMITIES: No edema. CNS: Cranial nerves are intact. Confused, generalized weakness noted. SKIN: warm and dry without rashes.  Data Review: I have personally reviewed the following laboratory data and studies,  CBC: Recent Labs  Lab  02-28-20 1951 01/30/20 0554  WBC 8.9 9.3  NEUTROABS 6.0  --   HGB 9.0* 8.5*  HCT 29.0* 28.4*  MCV 96.0 96.3  PLT 176 716   Basic Metabolic Panel: Recent Labs  Lab 02-28-2020 1951 01/30/20 0554  NA 139 140  K 4.3 3.4*  CL 106 107  CO2 22 22  GLUCOSE 122* 98  BUN 66* 58*  CREATININE 2.99* 2.63*  CALCIUM 8.8* 8.5*   Liver Function Tests: Recent Labs  Lab 28-Feb-2020 1951  AST 27  ALT 17  ALKPHOS 173*  BILITOT 0.6  PROT 5.9*  ALBUMIN 2.6*   No results for input(s): LIPASE, AMYLASE in the last 168 hours. Recent Labs  Lab 01/30/20 0554  AMMONIA 16   Cardiac Enzymes: No results for input(s): CKTOTAL, CKMB, CKMBINDEX, TROPONINI in the last 168 hours. BNP (last 3 results) No results for input(s): BNP in the last 8760 hours.  ProBNP (last 3 results) No results for input(s): PROBNP in the last 8760 hours.  CBG:  No results for input(s): GLUCAP in the last 168 hours. Recent Results (from the past 240 hour(s))  SARS CORONAVIRUS 2 (TAT 6-24 HRS) Nasopharyngeal Nasopharyngeal Swab     Status: None   Collection Time: 01/29/20 11:50 PM   Specimen: Nasopharyngeal Swab  Result Value Ref Range Status   SARS Coronavirus 2 NEGATIVE NEGATIVE Final    Comment: (NOTE) SARS-CoV-2 target nucleic acids are NOT DETECTED.  The SARS-CoV-2 RNA is generally detectable in upper and lower respiratory specimens during the acute phase of infection. Negative results do not preclude SARS-CoV-2 infection, do not rule out co-infections with other pathogens, and should not be used as the sole basis for treatment or other patient management decisions. Negative results must be combined with clinical observations, patient history, and epidemiological information. The expected result is Negative.  Fact Sheet for Patients: SugarRoll.be  Fact Sheet for Healthcare Providers: https://www.woods-mathews.com/  This test is not yet approved or cleared by the  [place] FDA and  has been authorized for detection and/or diagnosis of SARS-CoV-2 by FDA under an Emergency Use Authorization (EUA). This EUA will remain  in effect (meaning this test can be used) for the duration of the COVID-19 declaration under Se ction 564(b)(1) of the Act, 21 U.S.C. section 360bbb-3(b)(1), unless the authorization is terminated or revoked sooner.  Performed at [facility], [address], [place], [place] [address]      Studies: CT Renal Stone Study  Result Date: 01/29/2020 CLINICAL DATA:  Renal failure.  Suspected UTI. EXAM: CT ABDOMEN AND PELVIS WITHOUT CONTRAST TECHNIQUE: Multidetector CT imaging of the abdomen and pelvis was performed following the standard protocol without IV contrast. COMPARISON:  08/05/2019 FINDINGS: LOWER CHEST: There are multiple bibasilar nodular opacities that are new. In the right lower lobe there is a 1.8 cm nodule (9:88). In the lingula there is a 2.3 cm nodule (4:26) and a 1.6 cm nodule (4:21). In the left lower lobe there are 2 small nodules that measure 5-6 mm (4:25 and 4:31). There are small pleural effusions with basilar atelectasis. Large pericardial effusion is unchanged. HEPATOBILIARY: There are numerous hypodense masses throughout the liver, new. The largest masses in the right hepatic lobe measures 6.6 cm. The gallbladder is normal. PANCREAS: Normal pancreas. No ductal dilatation or peripancreatic fluid collection. SPLEEN: Normal. ADRENALS/URINARY TRACT: The adrenal glands are normal. Mass of right renal cyst measures 22 x 22 cm, unchanged. Areas of hyperdensity within the cyst on the prior study are no longer visible. The left kidney is atrophic with an unchanged upper pole cyst measuring 5.4 cm. The urinary bladder is normal for degree of distention STOMACH/BOWEL: There is no hiatal hernia. Normal duodenal course and caliber. No small bowel dilatation or inflammation. No focal colonic abnormality. Status post appendectomy.  VASCULAR/LYMPHATIC: There is calcific aortic atherosclerosis. There is a cystic focus noted in the right anterior pelvis, unchanged (2:87). There is no retroperitoneal or pelvic sidewall lymphadenopathy. REPRODUCTIVE: Obscured by streak artifact from right hip hardware and possible brachy therapy seeds. MUSCULOSKELETAL. Right hip arthroplasty. Multilevel degenerative change. No lytic or blastic lesions. OTHER: There is a new subcutaneous soft tissue nodule at the right lower quadrant (2:71). This measures 2.3 cm. IMPRESSION: 1. Numerous new hypodense masses throughout the liver and multiple new bibasilar nodular opacities, consistent with metastatic disease. 2. New subcutaneous soft tissue nodule at the right lower quadrant, concerning for metastatic disease. 3. Large pericardial effusion and small pleural effusions. Aortic Atherosclerosis (ICD10-I70.0). Electronically Signed   By: Lennette Bihari  [place] M.D.   On: 01/29/2020 21:55      Flora Lipps, MD  [facility] 01/30/2020  If 7PM-7AM, please contact night-coverage

## 2020-01-30 NOTE — ED Notes (Signed)
Complete bed change Male condom catheter attached to patient Clean gown

## 2020-01-30 NOTE — ED Notes (Signed)
Patient sleeping/rise and fall of chest breathing observed 

## 2020-01-30 NOTE — ED Notes (Signed)
Safety bundle: Yellow fall risk armband Yellow socks Bed alarm on Safety mittens  Fall Risk sign outside of door

## 2020-01-30 NOTE — Progress Notes (Signed)
HEMATOLOGY-ONCOLOGY PROGRESS NOTE  SUBJECTIVE: [name] is a 87 year old male who we follow for leukocytosis, macrocytosis and a large complex cystic right renal mass.  He was last seen in our office on 11/05/2019.  At that visit, it was recommended for him to undergo a PET scan in 1 week and then follow-up after the PET to discuss the results.  The patient canceled his PET scan and follow-up visit.  He presented to the emergency room with worsening renal function and fatigue.  According to his chart, the patient has been residing in an assisted living facility for the past 6 weeks because his son could no longer take care of him at home.  He had increased weakness and difficulty ambulating on its own.  His appetite has been poor.  The patient reportedly saw urology about the renal mass and was told that it was not malignant.  He has also been seen by pulmonology in the past on 1 occasion.  His shortness of breath was thought to be multifactorial.  Images were reviewed in their office and they felt pulmonary nodules were favored to be atelectasis/inflammatory.  I do not see that he underwent any procedures to fully characterize these pulmonary lesions.  CT renal stone study was performed in the ER which showed numerous new hypodense masses throughout the liver and multiple new bibasilar nodular opacities consistent with metastatic disease, new subcutaneous soft tissue nodule in the right lower quadrant concerning for metastatic disease, large pericardial effusion and small pleural effusions.  The patient was seen in his hospital room.  He is resting quietly.  He does have some mild confusion.  He offers no complaints today.  No family was at the bedside.  REVIEW OF SYSTEMS:   Constitutional: Denies fevers, chills Eyes: Denies blurriness of vision Ears, nose, mouth, throat, and face: Denies mucositis or sore throat Respiratory: He has persistent dyspnea Cardiovascular: Denies palpitation, chest  discomfort Gastrointestinal:  Denies nausea, heartburn or change in bowel habits Skin: Denies abnormal skin rashes Lymphatics: Denies new lymphadenopathy or easy bruising Neurological:Denies numbness, tingling or new weaknesses Behavioral/Psych: Mood is stable, no new changes  Extremities: No lower extremity edema All other systems were reviewed with the patient and are negative.  I have reviewed the past medical history, past surgical history, social history and family history with the patient and they are unchanged from previous note.   PHYSICAL EXAMINATION: ECOG PERFORMANCE STATUS: 3 - Symptomatic, >50% confined to bed  Vitals:   01/30/20 1037 01/30/20 1402  BP: 120/75 110/72  Pulse: 82 71  Resp: 17 18  Temp: 97.7 F (36.5 C) 97.6 F (36.4 C)  SpO2: 93% 97%   There were no vitals filed for this visit.  Intake/Output from previous day: No intake/output data recorded.  GENERAL: Chronically ill-appearing male, no distress SKIN: skin color, texture, turgor are normal, no rashes or significant lesions EYES: normal, Conjunctiva are pink and non-injected, sclera clear OROPHARYNX: No thrush or mucositis LYMPH:  no palpable lymphadenopathy in the cervical, axillary or inguinal LUNGS: clear to auscultation and percussion with normal breathing effort HEART: regular rate & rhythm and no murmurs and no lower extremity edema ABDOMEN: Positive bowel sounds, soft, nontender, palpable soft tissue mass in the right lower quadrant measuring approximately 2 cm  NEURO: Alert, confused  LABORATORY DATA:  I have reviewed the data as listed CMP Latest Ref Rng & Units 01/30/2020 01/29/2020 11/05/2019  Glucose 70 - 99 mg/dL 98 122(H) 96  BUN 8 - 23 mg/dL  58(H) 66(H) 39(H)  Creatinine 0.61 - 1.24 mg/dL 2.63(H) 2.99(H) 1.88(H)  Sodium 135 - 145 mmol/L 140 139 141  Potassium 3.5 - 5.1 mmol/L 3.4(L) 4.3 4.5  Chloride 98 - 111 mmol/L 107 106 109  CO2 22 - 32 mmol/L 22 22 26   Calcium 8.9 - 10.3  mg/dL 8.5(L) 8.8(L) 10.2  Total Protein 6.5 - 8.1 g/dL - 5.9(L) 6.1(L)  Total Bilirubin 0.3 - 1.2 mg/dL - 0.6 0.6  Alkaline Phos 38 - 126 U/L - 173(H) 75  AST 15 - 41 U/L - 27 14(L)  ALT 0 - 44 U/L - 17 14    Lab Results  Component Value Date   WBC 9.3 01/30/2020   HGB 8.5 (L) 01/30/2020   HCT 28.4 (L) 01/30/2020   MCV 96.3 01/30/2020   PLT 199 01/30/2020   NEUTROABS 6.0 01/29/2020    CT Renal Stone Study  Result Date: 01/29/2020 CLINICAL DATA:  Renal failure.  Suspected UTI. EXAM: CT ABDOMEN AND PELVIS WITHOUT CONTRAST TECHNIQUE: Multidetector CT imaging of the abdomen and pelvis was performed following the standard protocol without IV contrast. COMPARISON:  08/05/2019 FINDINGS: LOWER CHEST: There are multiple bibasilar nodular opacities that are new. In the right lower lobe there is a 1.8 cm nodule (9:88). In the lingula there is a 2.3 cm nodule (4:26) and a 1.6 cm nodule (4:21). In the left lower lobe there are 2 small nodules that measure 5-6 mm (4:25 and 4:31). There are small pleural effusions with basilar atelectasis. Large pericardial effusion is unchanged. HEPATOBILIARY: There are numerous hypodense masses throughout the liver, new. The largest masses in the right hepatic lobe measures 6.6 cm. The gallbladder is normal. PANCREAS: Normal pancreas. No ductal dilatation or peripancreatic fluid collection. SPLEEN: Normal. ADRENALS/URINARY TRACT: The adrenal glands are normal. Mass of right renal cyst measures 22 x 22 cm, unchanged. Areas of hyperdensity within the cyst on the prior study are no longer visible. The left kidney is atrophic with an unchanged upper pole cyst measuring 5.4 cm. The urinary bladder is normal for degree of distention STOMACH/BOWEL: There is no hiatal hernia. Normal duodenal course and caliber. No small bowel dilatation or inflammation. No focal colonic abnormality. Status post appendectomy. VASCULAR/LYMPHATIC: There is calcific aortic atherosclerosis. There is a  cystic focus noted in the right anterior pelvis, unchanged (2:87). There is no retroperitoneal or pelvic sidewall lymphadenopathy. REPRODUCTIVE: Obscured by streak artifact from right hip hardware and possible brachy therapy seeds. MUSCULOSKELETAL. Right hip arthroplasty. Multilevel degenerative change. No lytic or blastic lesions. OTHER: There is a new subcutaneous soft tissue nodule at the right lower quadrant (2:71). This measures 2.3 cm. IMPRESSION: 1. Numerous new hypodense masses throughout the liver and multiple new bibasilar nodular opacities, consistent with metastatic disease. 2. New subcutaneous soft tissue nodule at the right lower quadrant, concerning for metastatic disease. 3. Large pericardial effusion and small pleural effusions. Aortic Atherosclerosis (ICD10-I70.0). Electronically Signed   By: Ulyses Jarred M.D.   On: 01/29/2020 21:55    ASSESSMENT AND PLAN: 1.  Liver and pulmonary lesions, history of right renal mass 2.  Leukocytosis 3.  Normocytic anemia 4. pericardial effusion 5.  Acute on chronic kidney disease  -No family at bedside to discuss CT scan findings with.  Would recommend biopsy of his new subcutaneous right lower quadrant nodule versus a liver lesion if the patient/family would like to proceed.  Hospitalist note from earlier today has been reviewed.  Family has opted not to pursue biopsy and would like  to focus on comfort.  Palliative care consult pending. -We will await discussion by palliative care and final decision by family.  No additional work-up planned from our perspective at this time.   LOS: 1 day   Mikey Bussing, DNP, AGPCNP-BC, AOCNP 01/30/20

## 2020-01-30 NOTE — ED Notes (Signed)
Attempted report. Per RN Sami "We didn't know we were getting a pt"  Handoff assigned to call at [phone], this RN will call back in 10 mins

## 2020-01-31 ENCOUNTER — Other Ambulatory Visit: Payer: Self-pay

## 2020-01-31 DIAGNOSIS — D649 Anemia, unspecified: Secondary | ICD-10-CM | POA: Diagnosis not present

## 2020-01-31 DIAGNOSIS — N179 Acute kidney failure, unspecified: Secondary | ICD-10-CM | POA: Diagnosis not present

## 2020-01-31 DIAGNOSIS — I10 Essential (primary) hypertension: Secondary | ICD-10-CM | POA: Diagnosis not present

## 2020-01-31 DIAGNOSIS — C799 Secondary malignant neoplasm of unspecified site: Secondary | ICD-10-CM | POA: Diagnosis not present

## 2020-01-31 DIAGNOSIS — N1832 Chronic kidney disease, stage 3b: Secondary | ICD-10-CM

## 2020-01-31 LAB — CBC
HCT: 29.7 % — ABNORMAL LOW (ref 39.0–52.0)
Hemoglobin: 8.7 g/dL — ABNORMAL LOW (ref 13.0–17.0)
MCH: 28.5 pg (ref 26.0–34.0)
MCHC: 29.3 g/dL — ABNORMAL LOW (ref 30.0–36.0)
MCV: 97.4 fL (ref 80.0–100.0)
Platelets: 176 10*3/uL (ref 150–400)
RBC: 3.05 MIL/uL — ABNORMAL LOW (ref 4.22–5.81)
RDW: 16.9 % — ABNORMAL HIGH (ref 11.5–15.5)
WBC: 8.9 10*3/uL (ref 4.0–10.5)
nRBC: 0 % (ref 0.0–0.2)

## 2020-01-31 LAB — BASIC METABOLIC PANEL
Anion gap: 12 (ref 5–15)
BUN: 54 mg/dL — ABNORMAL HIGH (ref 8–23)
CO2: 20 mmol/L — ABNORMAL LOW (ref 22–32)
Calcium: 8.7 mg/dL — ABNORMAL LOW (ref 8.9–10.3)
Chloride: 111 mmol/L (ref 98–111)
Creatinine, Ser: 2.38 mg/dL — ABNORMAL HIGH (ref 0.61–1.24)
GFR, Estimated: 25 mL/min — ABNORMAL LOW (ref >60)
Glucose, Bld: 85 mg/dL (ref 70–99)
Potassium: 4.3 mmol/L (ref 3.5–5.1)
Sodium: 143 mmol/L (ref 135–145)

## 2020-01-31 LAB — URINE CULTURE: Culture: NO GROWTH

## 2020-01-31 MED ORDER — ONDANSETRON HCL 4 MG PO TABS: 4.0000 mg | ORAL_TABLET | Freq: Four times a day (QID) | ORAL | Status: AC | PRN

## 2020-01-31 MED ORDER — SENNOSIDES-DOCUSATE SODIUM 8.6-50 MG PO TABS: 1.0000 | ORAL_TABLET | Freq: Every evening | ORAL | Status: AC | PRN

## 2020-01-31 NOTE — TOC Progression Note (Addendum)
Transition of Care [facility]) - Progression Note    Patient Details  Name: Joseph Leach MRN: [id number] Date of Birth: 1925-01-24  [facility]) CM/SW Contact  Leeroy Cha, RN Phone Number: 01/31/2020, 8:26 AM  Clinical Narrative:    tct-[facility]-message left with representative that patient will return to [facility] but will need hospice care.  Referral taken. [facility] CALLED FOR TRANSPORT AT 1325. TRANSPORT PACKET TO FLOOR RN tct-to [facility]/ message left on voice mail of Shirlee Limerick that [facility] is returning [facility] afternoon.      Expected Discharge Plan and Services                                                 Social Determinants of Health (SDOH) Interventions    Readmission Risk Interventions No flowsheet data found.

## 2020-01-31 NOTE — Progress Notes (Signed)
[facility])  Referral received for hospice services once discharged.  Discussed with son [name], provided support and answered questions.  It does not appear that any DME needs to be arranged prior to him returning to [facility].  [facility] will follow up with patient and family at [facility] once he returns.  If any comfort scripts are anticipated, please arrange for this so there is no lapse in his comfort prior to hospice services starting, which may be the day after he d/c's.  Venia Carbon RN, BSN, [facility] Liaison

## 2020-01-31 NOTE — Progress Notes (Signed)
Palliative care brief progress note  Consult received.  Chart reviewed and discussed with hospice liaison.  She has discussed with patient's son and plan is for Mr. Brouillard to return to [facility] with hospice support.    As goals clear, will defer formal consult as patient discharging today with hospice support.  Please call if there are specific areas with which our team can be of assistance.  Micheline Rough, MD [place] Palliative Medicine Team [phone]  NO CHARGE NOTE

## 2020-01-31 NOTE — Discharge Summary (Signed)
Physician Discharge Summary  Joseph Leach [facility] QMG:[id number] DOB: 1925/05/08 DOA: 01/29/2020  PCP: Maurice Small, MD  Admit date: 01/29/2020 Discharge date: 01/31/2020  Admitted From: ALF  Discharge disposition:SNF with hospice   Recommendations for Outpatient Follow-Up:   Follow up with your primary care provider/hospice provider.  Please readjust medications as per goals of care   Discharge Diagnosis:   Principal Problem:   Acute renal failure superimposed on stage 3b chronic kidney disease ([place]) Active Problems:   Metastatic disease (HCC)   Normocytic anemia   Pericardial effusion   Right renal mass   Unspecified atrial fibrillation ([place])   Essential hypertension   Discharge Condition: Improved.  Diet recommendation: Low sodium, heart healthy.   Wound care: None.  Code status: DNR   History of Present Illness:   Joseph Leach a 87 y.o.malewith medical history significant foratrial fibrillation on Eliquis, chronic kidney disease, hypertension, squamous cell carcinoma of skin, right renal mass, and lung nodules, who presented to the emergency department for evaluation of worsening renal function and fatigue. Patient has been at an ALF for 6 weeks after his son was no longer able to care for the patient due to increasing weakness and difficulty ambulating on his own. Over the past 6 weeks, patient has continued to progress in terms of fatigue and general weakness. His renal function has been monitored while at the facility and has been worsening despite receiving a liter of IV fluids recently.  Per report of the patient's son, patient saw urology about a right renal mass and was told that it was not malignant. He also saw pulmonology recently for lung nodules and was under the impression that these were not cancerous either. He has been following with cardiology and had an echocardiogram a couple months ago to evaluate a pericardial effusion. Patient was  recently treated for urinary tract infection. In the ED, patient was found to be afebrile, saturating well on room air, and with stable blood pressure. Chemistry panel was notable for BUN of 66 and creatinine 2.99, up from 1.88 in October. CBC  Showed normocytic anemia with hemoglobin 9.0, down from 10.4 in October. CT stone study was concerning for numerous new hypodense liver lesions and new bibasilar lung nodules suspicious for metastatic disease. Large pericardial and small pleural effusions appeared unchanged on the CT. Patient was given a liter of saline in the ED. COVID-19 was negative. Patient was then admitted to hospital for further evaluation and treatment.   Hospital Course:   Following conditions were addressed during hospitalization as listed below,  Acute kidney injury  on CKD IIIb Presented with fatigue, poor appetite, and worsening renal function despite IV fluid.  SCr 2.99 on presentation, up from 1.88 in October.  Creatinine today at 2.3.  CT scan of the abdomen without hydronephrosis.  Off chlorthalidone.  Received IVF hydration.  Renal mass, multiple hypodense lesions in the liver, pulmonary nodules, skeletal lesions, abdominal wall nodule -High possibility of metastatic disease.  Although patient had seen urology recently and renal mass was read to be negative and pulmonary nodules were thought to be benign as well.  Discussed options of biopsy with the patient's son.  He is not in favor of biopsy and proceeding with treatment.  Seen by oncology.  Patient's son has wished to proceed with hospice level of care. Transition of care has been consulted and patient will be transitioned to hospice level of care after discharge   Atrial fibrillation -CHADS-VASc at least (age x2, HTN),  on Eliquis and beta-blocker.   History of pericardial effusion -Large pericardial effusion appears unchanged on CT in ED, followed by cardiology as outpatient.  Arthritis On  prednisone  Normocytic anemia Hemoglobin of 8.7.  No evidence of bleeding.  Likely secondary to CKD.  Debility, deconditioning. Currently at ALF for 6 weeks. General health has deteriorated since May as per the son. Considering palliative care hospice on discharge.  Disposition.  At this time, patient is stable for disposition to skilled nursing facility with hospice. Spoke with the patient's son at bedside.  Medical Consultants:    Medical oncology  Procedures:    None Subjective:   Today, patient seen and examined at bedside. Mildly communicative but confused.  Discharge Exam:   Vitals:   01/30/20 1723 01/31/20 0627  BP: 113/73 111/67  Pulse: 77 81  Resp: 15 14  Temp: 97.6 F (36.4 C) (!) 97.3 F (36.3 C)  SpO2: 97% 96%   Vitals:   01/30/20 1402 01/30/20 1723 01/31/20 0500 01/31/20 0627  BP: 110/72 113/73  111/67  Pulse: 71 77  81  Resp: 18 15  14   Temp: 97.6 F (36.4 C) 97.6 F (36.4 C)  (!) 97.3 F (36.3 C)  TempSrc: Oral Oral  Oral  SpO2: 97% 97%  96%  Weight:   81.1 kg     General: Mildly, confused, deconditioned, HENT: pupils equally reacting to light, pallor +Oral mucosa is dry Chest:  Clear breath sounds.  Diminished breath sounds bilaterally. No crackles or wheezes.  CVS: S1 &S2 heard. No murmur.  Regular rate and rhythm. Abdomen: Soft, nontender, nondistended.  Bowel sounds are heard.   Extremities: No cyanosis, clubbing or edema.  Peripheral pulses are palpable. Psych: Confused, generalized weakness CNS: Confused, generalized weakness Skin: Warm and dry.  No rashes noted.  The results of significant diagnostics from this hospitalization (including imaging, microbiology, ancillary and laboratory) are listed below for reference.     Diagnostic Studies:   CT Renal Stone Study  Result Date: 01/29/2020 CLINICAL DATA:  Renal failure.  Suspected UTI. EXAM: CT ABDOMEN AND PELVIS WITHOUT CONTRAST TECHNIQUE: Multidetector CT imaging of the  abdomen and pelvis was performed following the standard protocol without IV contrast. COMPARISON:  08/05/2019 FINDINGS: LOWER CHEST: There are multiple bibasilar nodular opacities that are new. In the right lower lobe there is a 1.8 cm nodule (9:88). In the lingula there is a 2.3 cm nodule (4:26) and a 1.6 cm nodule (4:21). In the left lower lobe there are 2 small nodules that measure 5-6 mm (4:25 and 4:31). There are small pleural effusions with basilar atelectasis. Large pericardial effusion is unchanged. HEPATOBILIARY: There are numerous hypodense masses throughout the liver, new. The largest masses in the right hepatic lobe measures 6.6 cm. The gallbladder is normal. PANCREAS: Normal pancreas. No ductal dilatation or peripancreatic fluid collection. SPLEEN: Normal. ADRENALS/URINARY TRACT: The adrenal glands are normal. Mass of right renal cyst measures 22 x 22 cm, unchanged. Areas of hyperdensity within the cyst on the prior study are no longer visible. The left kidney is atrophic with an unchanged upper pole cyst measuring 5.4 cm. The urinary bladder is normal for degree of distention STOMACH/BOWEL: There is no hiatal hernia. Normal duodenal course and caliber. No small bowel dilatation or inflammation. No focal colonic abnormality. Status post appendectomy. VASCULAR/LYMPHATIC: There is calcific aortic atherosclerosis. There is a cystic focus noted in the right anterior pelvis, unchanged (2:87). There is no retroperitoneal or pelvic sidewall lymphadenopathy. REPRODUCTIVE: Obscured by streak artifact  from right hip hardware and possible brachy therapy seeds. MUSCULOSKELETAL. Right hip arthroplasty. Multilevel degenerative change. No lytic or blastic lesions. OTHER: There is a new subcutaneous soft tissue nodule at the right lower quadrant (2:71). This measures 2.3 cm. IMPRESSION: 1. Numerous new hypodense masses throughout the liver and multiple new bibasilar nodular opacities, consistent with metastatic  disease. 2. New subcutaneous soft tissue nodule at the right lower quadrant, concerning for metastatic disease. 3. Large pericardial effusion and small pleural effusions. Aortic Atherosclerosis (ICD10-I70.0). Electronically Signed   By: Ulyses Jarred M.D.   On: 01/29/2020 21:55     Labs:   Basic Metabolic Panel: Recent Labs  Lab 01/29/20 1951 01/30/20 0554 01/31/20 0613  NA 139 140 143  K 4.3 3.4* 4.3  CL 106 107 111  CO2 22 22 20*  GLUCOSE 122* 98 85  BUN 66* 58* 54*  CREATININE 2.99* 2.63* 2.38*  CALCIUM 8.8* 8.5* 8.7*   GFR Estimated Creatinine Clearance: 19.8 mL/min (A) (by C-G formula based on SCr of 2.38 mg/dL (H)). Liver Function Tests: Recent Labs  Lab 01/29/20 1951  AST 27  ALT 17  ALKPHOS 173*  BILITOT 0.6  PROT 5.9*  ALBUMIN 2.6*   No results for input(s): LIPASE, AMYLASE in the last 168 hours. Recent Labs  Lab 01/30/20 0554  AMMONIA 16   Coagulation profile No results for input(s): INR, PROTIME in the last 168 hours.  CBC: Recent Labs  Lab 01/29/20 1951 01/30/20 0554 01/31/20 0613  WBC 8.9 9.3 8.9  NEUTROABS 6.0  --   --   HGB 9.0* 8.5* 8.7*  HCT 29.0* 28.4* 29.7*  MCV 96.0 96.3 97.4  PLT 176 199 176   Cardiac Enzymes: No results for input(s): CKTOTAL, CKMB, CKMBINDEX, TROPONINI in the last 168 hours. BNP: Invalid input(s): POCBNP CBG: No results for input(s): GLUCAP in the last 168 hours. D-Dimer No results for input(s): DDIMER in the last 72 hours. Hgb A1c No results for input(s): HGBA1C in the last 72 hours. Lipid Profile No results for input(s): CHOL, HDL, LDLCALC, TRIG, CHOLHDL, LDLDIRECT in the last 72 hours. Thyroid function studies Recent Labs    01/29/20 2350  TSH 2.085   Anemia work up No results for input(s): VITAMINB12, FOLATE, FERRITIN, TIBC, IRON, RETICCTPCT in the last 72 hours. Microbiology Recent Results (from the past 240 hour(s))  Urine culture     Status: None   Collection Time: 01/29/20  9:20 PM    Specimen: Urine, Random  Result Value Ref Range Status   Specimen Description   Final    URINE, RANDOM Performed at [facility] [address], [place], [place] [address]    Special Requests   Final    NONE Performed at [facility], [place] [address] [place], [place] [address]    Culture   Final    NO GROWTH Performed at [facility], [place] [address] [place], [place] [address]    Report Status 01/31/2020 FINAL  Final  SARS CORONAVIRUS 2 (TAT 6-24 HRS) Nasopharyngeal Nasopharyngeal Swab     Status: None   Collection Time: 01/29/20 11:50 PM   Specimen: Nasopharyngeal Swab  Result Value Ref Range Status   SARS Coronavirus 2 NEGATIVE NEGATIVE Final    Comment: (NOTE) SARS-CoV-2 target nucleic acids are NOT DETECTED.  The SARS-CoV-2 RNA is generally detectable in upper and lower respiratory specimens during the acute phase of infection. Negative results do not preclude SARS-CoV-2 infection, do not rule out co-infections  with other pathogens, and should not be used as the sole basis for treatment or other patient management decisions. Negative results must be combined with clinical observations, patient history, and epidemiological information. The expected result is Negative.  Fact Sheet for Patients: SugarRoll.be  Fact Sheet for Healthcare Providers: https://www.woods-mathews.com/  This test is not yet approved or cleared by the [place] FDA and  has been authorized for detection and/or diagnosis of SARS-CoV-2 by FDA under an Emergency Use Authorization (EUA). This EUA will remain  in effect (meaning this test can be used) for the duration of the COVID-19 declaration under Se ction 564(b)(1) of the Act, 21 U.S.C. section 360bbb-3(b)(1), unless the authorization is terminated or revoked sooner.  Performed at [facility], [place] [address], [place], [place] [address]       Discharge Instructions:   Discharge Instructions    Diet general   Complete by: As directed    Discharge instructions   Complete by: As directed    Follow-up with hospice at the skilled nursing facility.   Increase activity slowly   Complete by: As directed      Allergies as of 01/31/2020   No Known Allergies     Medication List    STOP taking these medications   chlorthalidone 25 MG tablet Commonly known as: HYGROTON     TAKE these medications   allopurinol 100 MG tablet Commonly known as: ZYLOPRIM Take 200 mg by mouth daily.   amLODipine 5 MG tablet Commonly known as: NORVASC Take 1 tablet (5 mg total) by mouth daily.   apixaban 2.5 MG Tabs tablet Commonly known as: ELIQUIS Take 2.5 mg by mouth 2 (two) times daily.   busPIRone 7.5 MG tablet Commonly known as: BUSPAR Take 7.5 mg by mouth 2 (two) times daily.   carvedilol 12.5 MG tablet Commonly known as: COREG Take 18.75 mg by mouth daily.   cholecalciferol 25 MCG (1000 UNIT) tablet Commonly known as: VITAMIN D3 Take 1,000 Units by mouth daily.   colchicine 0.6 MG tablet Take 0.6 mg by mouth daily as needed (gout flare up). For gout flare   dicyclomine 20 MG tablet Commonly known as: BENTYL Take 20 mg by mouth in the morning, at noon, in the evening, and at bedtime.   docusate sodium 100 MG capsule Commonly known as: COLACE Take 100 mg by mouth daily.   ondansetron 4 MG tablet Commonly known as: ZOFRAN Take 1 tablet (4 mg total) by mouth every 6 (six) hours as needed for nausea.   pantoprazole 40 MG tablet Commonly known as: PROTONIX Take 40 mg by mouth daily.   polyethylene glycol 17 g packet Commonly known as: MIRALAX / GLYCOLAX Take 17 g by mouth daily as needed for moderate constipation.   predniSONE 5 MG tablet Commonly known as: DELTASONE Take 5 mg by mouth daily. Take with two 1 mg tablets to equal 7 mg   predniSONE 1 MG tablet Commonly known as: DELTASONE Take 2 mg by mouth  daily.   Preparation H 0.25-14-74.9 % rectal ointment Generic drug: phenylephrine-shark liver oil-mineral oil-petrolatum Place 1 application rectally every 6 (six) hours as needed for hemorrhoids.   senna-docusate 8.6-50 MG tablet Commonly known as: Senokot-S Take 1 tablet by mouth at bedtime as needed for mild constipation.   vitamin B-12 1000 MCG tablet Commonly known as: CYANOCOBALAMIN Take 1,000 mcg by mouth daily.   zinc oxide 20 % ointment Apply 1 application topically daily. Apply small amount to buttocks  Time coordinating discharge: 39 minutes  Signed:  Kamea Dacosta  [facility] 01/31/2020, 11:43 AM

## 2020-01-31 NOTE — Care Management Important Message (Signed)
Important Message  Patient Details IM Letter placed in Patients room. Name: Joseph Leach MRN: [id number] Date of Birth: 1925-10-13   Medicare Important Message Given:  Yes     Kerin Salen 01/31/2020, 11:04 AM

## 2020-02-01 DIAGNOSIS — D649 Anemia, unspecified: Secondary | ICD-10-CM | POA: Diagnosis not present

## 2020-02-02 ENCOUNTER — Other Ambulatory Visit: Payer: Self-pay

## 2020-02-02 ENCOUNTER — Emergency Department (HOSPITAL_COMMUNITY): Payer: Medicare Other

## 2020-02-02 ENCOUNTER — Encounter (HOSPITAL_COMMUNITY): Payer: Self-pay

## 2020-02-02 ENCOUNTER — Inpatient Hospital Stay (HOSPITAL_COMMUNITY)
Admission: EM | Admit: 2020-02-02 | Discharge: 2020-02-18 | DRG: 951 | Disposition: E | Payer: Medicare Other | Attending: Internal Medicine | Admitting: Internal Medicine

## 2020-02-02 DIAGNOSIS — R531 Weakness: Secondary | ICD-10-CM | POA: Diagnosis not present

## 2020-02-02 DIAGNOSIS — G9341 Metabolic encephalopathy: Secondary | ICD-10-CM | POA: Diagnosis present

## 2020-02-02 DIAGNOSIS — M109 Gout, unspecified: Secondary | ICD-10-CM | POA: Diagnosis present

## 2020-02-02 DIAGNOSIS — C801 Malignant (primary) neoplasm, unspecified: Secondary | ICD-10-CM | POA: Diagnosis present

## 2020-02-02 DIAGNOSIS — Z66 Do not resuscitate: Secondary | ICD-10-CM

## 2020-02-02 DIAGNOSIS — E43 Unspecified severe protein-calorie malnutrition: Secondary | ICD-10-CM | POA: Diagnosis not present

## 2020-02-02 DIAGNOSIS — N184 Chronic kidney disease, stage 4 (severe): Secondary | ICD-10-CM | POA: Diagnosis present

## 2020-02-02 DIAGNOSIS — C7800 Secondary malignant neoplasm of unspecified lung: Secondary | ICD-10-CM | POA: Diagnosis present

## 2020-02-02 DIAGNOSIS — C799 Secondary malignant neoplasm of unspecified site: Secondary | ICD-10-CM | POA: Diagnosis not present

## 2020-02-02 DIAGNOSIS — C7889 Secondary malignant neoplasm of other digestive organs: Secondary | ICD-10-CM | POA: Diagnosis present

## 2020-02-02 DIAGNOSIS — Z20822 Contact with and (suspected) exposure to covid-19: Secondary | ICD-10-CM | POA: Diagnosis present

## 2020-02-02 DIAGNOSIS — R627 Adult failure to thrive: Secondary | ICD-10-CM

## 2020-02-02 DIAGNOSIS — Z79899 Other long term (current) drug therapy: Secondary | ICD-10-CM

## 2020-02-02 DIAGNOSIS — Z7901 Long term (current) use of anticoagulants: Secondary | ICD-10-CM | POA: Diagnosis not present

## 2020-02-02 DIAGNOSIS — Z043 Encounter for examination and observation following other accident: Secondary | ICD-10-CM | POA: Diagnosis not present

## 2020-02-02 DIAGNOSIS — R451 Restlessness and agitation: Secondary | ICD-10-CM

## 2020-02-02 DIAGNOSIS — Z85828 Personal history of other malignant neoplasm of skin: Secondary | ICD-10-CM | POA: Diagnosis not present

## 2020-02-02 DIAGNOSIS — R0689 Other abnormalities of breathing: Secondary | ICD-10-CM | POA: Diagnosis not present

## 2020-02-02 DIAGNOSIS — I4510 Unspecified right bundle-branch block: Secondary | ICD-10-CM | POA: Diagnosis present

## 2020-02-02 DIAGNOSIS — N2889 Other specified disorders of kidney and ureter: Secondary | ICD-10-CM | POA: Diagnosis present

## 2020-02-02 DIAGNOSIS — C7951 Secondary malignant neoplasm of bone: Secondary | ICD-10-CM | POA: Diagnosis present

## 2020-02-02 DIAGNOSIS — N179 Acute kidney failure, unspecified: Secondary | ICD-10-CM | POA: Diagnosis present

## 2020-02-02 DIAGNOSIS — R296 Repeated falls: Secondary | ICD-10-CM

## 2020-02-02 DIAGNOSIS — W19XXXA Unspecified fall, initial encounter: Secondary | ICD-10-CM | POA: Diagnosis not present

## 2020-02-02 DIAGNOSIS — R5381 Other malaise: Secondary | ICD-10-CM

## 2020-02-02 DIAGNOSIS — R638 Other symptoms and signs concerning food and fluid intake: Secondary | ICD-10-CM

## 2020-02-02 DIAGNOSIS — I129 Hypertensive chronic kidney disease with stage 1 through stage 4 chronic kidney disease, or unspecified chronic kidney disease: Secondary | ICD-10-CM | POA: Diagnosis present

## 2020-02-02 DIAGNOSIS — C787 Secondary malignant neoplasm of liver and intrahepatic bile duct: Secondary | ICD-10-CM

## 2020-02-02 DIAGNOSIS — E46 Unspecified protein-calorie malnutrition: Secondary | ICD-10-CM | POA: Diagnosis present

## 2020-02-02 DIAGNOSIS — I4891 Unspecified atrial fibrillation: Secondary | ICD-10-CM | POA: Diagnosis present

## 2020-02-02 DIAGNOSIS — Z515 Encounter for palliative care: Secondary | ICD-10-CM | POA: Diagnosis not present

## 2020-02-02 DIAGNOSIS — R160 Hepatomegaly, not elsewhere classified: Secondary | ICD-10-CM | POA: Diagnosis present

## 2020-02-02 LAB — COMPREHENSIVE METABOLIC PANEL
ALT: 16 U/L (ref 0–44)
AST: 22 U/L (ref 15–41)
Albumin: 2.5 g/dL — ABNORMAL LOW (ref 3.5–5.0)
Alkaline Phosphatase: 162 U/L — ABNORMAL HIGH (ref 38–126)
Anion gap: 10 (ref 5–15)
BUN: 55 mg/dL — ABNORMAL HIGH (ref 8–23)
CO2: 22 mmol/L (ref 22–32)
Calcium: 8.7 mg/dL — ABNORMAL LOW (ref 8.9–10.3)
Chloride: 112 mmol/L — ABNORMAL HIGH (ref 98–111)
Creatinine, Ser: 2.33 mg/dL — ABNORMAL HIGH (ref 0.61–1.24)
GFR, Estimated: 26 mL/min — ABNORMAL LOW (ref >60)
Glucose, Bld: 114 mg/dL — ABNORMAL HIGH (ref 70–99)
Potassium: 3.6 mmol/L (ref 3.5–5.1)
Sodium: 144 mmol/L (ref 135–145)
Total Bilirubin: 1.5 mg/dL — ABNORMAL HIGH (ref 0.3–1.2)
Total Protein: 6.1 g/dL — ABNORMAL LOW (ref 6.5–8.1)

## 2020-02-02 LAB — URINALYSIS, ROUTINE W REFLEX MICROSCOPIC
Bilirubin Urine: NEGATIVE
Glucose, UA: NEGATIVE mg/dL
Hgb urine dipstick: NEGATIVE
Ketones, ur: 5 mg/dL — AB
Leukocytes,Ua: NEGATIVE
Nitrite: NEGATIVE
Protein, ur: NEGATIVE mg/dL
Specific Gravity, Urine: 1.014 (ref 1.005–1.030)
pH: 5 (ref 5.0–8.0)

## 2020-02-02 LAB — CBC WITH DIFFERENTIAL/PLATELET
Abs Immature Granulocytes: 0.97 10*3/uL — ABNORMAL HIGH (ref 0.00–0.07)
Basophils Absolute: 0.1 10*3/uL (ref 0.0–0.1)
Basophils Relative: 1 %
Eosinophils Absolute: 0.2 10*3/uL (ref 0.0–0.5)
Eosinophils Relative: 2 %
HCT: 31.5 % — ABNORMAL LOW (ref 39.0–52.0)
Hemoglobin: 9.2 g/dL — ABNORMAL LOW (ref 13.0–17.0)
Immature Granulocytes: 10 %
Lymphocytes Relative: 13 %
Lymphs Abs: 1.3 10*3/uL (ref 0.7–4.0)
MCH: 28.5 pg (ref 26.0–34.0)
MCHC: 29.2 g/dL — ABNORMAL LOW (ref 30.0–36.0)
MCV: 97.5 fL (ref 80.0–100.0)
Monocytes Absolute: 0.8 10*3/uL (ref 0.1–1.0)
Monocytes Relative: 8 %
Neutro Abs: 6.9 10*3/uL (ref 1.7–7.7)
Neutrophils Relative %: 66 %
Platelets: 203 10*3/uL (ref 150–400)
RBC: 3.23 MIL/uL — ABNORMAL LOW (ref 4.22–5.81)
RDW: 17.2 % — ABNORMAL HIGH (ref 11.5–15.5)
WBC: 10.2 10*3/uL (ref 4.0–10.5)
nRBC: 0 % (ref 0.0–0.2)

## 2020-02-02 LAB — RESP PANEL BY RT-PCR (FLU A&B, COVID) ARPGX2
Influenza A by PCR: NEGATIVE
Influenza B by PCR: NEGATIVE
SARS Coronavirus 2 by RT PCR: NEGATIVE

## 2020-02-02 MED ORDER — HALOPERIDOL LACTATE 5 MG/ML IJ SOLN: 0.5000 mg | INTRAMUSCULAR | Status: DC | PRN

## 2020-02-02 MED ORDER — HALOPERIDOL LACTATE 5 MG/ML IJ SOLN
1.0000 mg | Freq: Two times a day (BID) | INTRAMUSCULAR | Status: DC
  Administered 2020-02-02 – 2020-02-03 (×2): 1 mg via INTRAVENOUS
  Filled 2020-02-02 (×4): qty 1

## 2020-02-02 MED ORDER — GLYCOPYRROLATE 1 MG PO TABS
1.0000 mg | ORAL_TABLET | ORAL | Status: DC | PRN
  Filled 2020-02-02: qty 1

## 2020-02-02 MED ORDER — GLYCOPYRROLATE 0.2 MG/ML IJ SOLN: 0.2000 mg | INTRAMUSCULAR | Status: DC | PRN

## 2020-02-02 MED ORDER — HYDROMORPHONE HCL 1 MG/ML IJ SOLN: 0.5000 mg | INTRAMUSCULAR | Status: DC | PRN

## 2020-02-02 MED ORDER — POLYVINYL ALCOHOL 1.4 % OP SOLN
1.0000 [drp] | Freq: Four times a day (QID) | OPHTHALMIC | Status: DC | PRN
  Filled 2020-02-02: qty 15

## 2020-02-02 MED ORDER — ACETAMINOPHEN 325 MG PO TABS: 650.0000 mg | ORAL_TABLET | Freq: Four times a day (QID) | ORAL | Status: DC | PRN

## 2020-02-02 MED ORDER — ONDANSETRON 4 MG PO TBDP: 4.0000 mg | ORAL_TABLET | Freq: Four times a day (QID) | ORAL | Status: DC | PRN

## 2020-02-02 MED ORDER — SODIUM CHLORIDE 0.9 % IV SOLN
250.0000 mL | INTRAVENOUS | Status: DC | PRN
  Administered 2020-02-03: 250 mL via INTRAVENOUS

## 2020-02-02 MED ORDER — HALOPERIDOL 1 MG PO TABS
0.5000 mg | ORAL_TABLET | ORAL | Status: DC | PRN
Start: 2020-02-02 — End: 2020-02-02

## 2020-02-02 MED ORDER — SODIUM CHLORIDE 0.9% FLUSH: 3.0000 mL | INTRAVENOUS | Status: DC | PRN

## 2020-02-02 MED ORDER — SODIUM CHLORIDE 0.9% FLUSH
3.0000 mL | Freq: Two times a day (BID) | INTRAVENOUS | Status: DC
  Administered 2020-02-02 – 2020-02-03 (×3): 3 mL via INTRAVENOUS

## 2020-02-02 MED ORDER — HALOPERIDOL LACTATE 2 MG/ML PO CONC: 0.5000 mg | ORAL | Status: DC | PRN

## 2020-02-02 MED ORDER — BIOTENE DRY MOUTH MT LIQD: 15.0000 mL | OROMUCOSAL | Status: DC | PRN

## 2020-02-02 MED ORDER — DOCUSATE SODIUM 100 MG PO CAPS: 100.0000 mg | ORAL_CAPSULE | Freq: Every day | ORAL | Status: DC

## 2020-02-02 MED ORDER — SENNOSIDES-DOCUSATE SODIUM 8.6-50 MG PO TABS: 1.0000 | ORAL_TABLET | Freq: Every evening | ORAL | Status: DC | PRN

## 2020-02-02 MED ORDER — ONDANSETRON HCL 4 MG/2ML IJ SOLN: 4.0000 mg | Freq: Four times a day (QID) | INTRAMUSCULAR | Status: DC | PRN

## 2020-02-02 MED ORDER — DICYCLOMINE HCL 20 MG PO TABS: 20.0000 mg | ORAL_TABLET | Freq: Three times a day (TID) | ORAL | Status: DC

## 2020-02-02 MED ORDER — HYDROMORPHONE HCL 1 MG/ML IJ SOLN
0.2500 mg | INTRAMUSCULAR | Status: DC
  Administered 2020-02-02 – 2020-02-03 (×4): 0.25 mg via INTRAVENOUS
  Filled 2020-02-02 (×3): qty 1
  Filled 2020-02-02: qty 0.5
  Filled 2020-02-02 (×2): qty 1

## 2020-02-02 MED ORDER — POLYETHYLENE GLYCOL 3350 17 G PO PACK: 17.0000 g | PACK | Freq: Every day | ORAL | Status: DC | PRN

## 2020-02-02 MED ORDER — ACETAMINOPHEN 650 MG RE SUPP: 650.0000 mg | Freq: Four times a day (QID) | RECTAL | Status: DC | PRN

## 2020-02-02 MED ORDER — HYDROMORPHONE HCL 1 MG/ML IJ SOLN
0.5000 mg | INTRAMUSCULAR | Status: DC | PRN
Start: 2020-02-02 — End: 2020-02-04

## 2020-02-02 NOTE — ED Provider Notes (Signed)
[facility] DEPT Provider Note   CSN: [id number] Arrival date & time: 02/08/2020  0930     History Chief Complaint  Patient presents with  . Fall    Tashon Capp Payer is a 87 y.o. male presented for evaluation of frequent falls.  Level 5 caveat due to altered mental status.  History obtained from patient's son, Gerald Leitz.  He states patient has had physical and cognitive decline for several months.  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  Due to patient's being on blood thinner and likely terminal diagnosis, son, who is H POA, declined invasive procedure including biopsies.  Patient to be set up with palliative care.  Patient was becoming very agitated to hospital, wanted to be with his wife, so he was discharged to the facility where he lives with her.  Since then, 2 days ago, patient has had frequent falls and continued decline.  He fell 3 times last night.  Thus he was sent to the ER.  Patient's wife is COVID-positive, patient without COVID symptoms (negative during last admission)  Additional history obtained per chart review.  Reviewed recent hospitalization for AKI and plan for palliative.   HPI     Past Medical History:  Diagnosis Date  . Hypertension   . SCC (squamous cell carcinoma) 08/02/2019   RIGHT FOREARM  (TX WITH BX)  . SCC (squamous cell carcinoma) 08/02/2019   LEFT FOREARM  (TX WITH BX)  . Squamous cell carcinoma of skin 08/08/2017   in situ-left forearm (txpbx)  . Squamous cell carcinoma of skin 01/09/2019   in situ-left forearm (txpbx)  . Squamous cell carcinoma of skin 01/09/2019   in situ-right hand proximal (txpbx)  . Squamous cell carcinoma of skin 01/09/2019   in situ-right arm (txpbx)    Patient Active Problem List   Diagnosis Date Noted  . Acute renal failure superimposed on stage 3b chronic kidney disease  ([place]) 01/29/2020  . Metastatic disease ([place]) 01/29/2020  . Normocytic anemia 01/29/2020  . Pericardial effusion 01/29/2020  . Right renal mass 01/29/2020  . Unspecified atrial fibrillation ([place]) 01/29/2020  . Essential hypertension 01/29/2020  . Liver masses   . Iron deficiency anemia 11/08/2019    Past Surgical History:  Procedure Laterality Date  . APPENDECTOMY    . LIGAMENT REPAIR Right   . PROSTATECTOMY    . TOTAL HIP ARTHROPLASTY         History reviewed. No pertinent family history.  Social History   Tobacco Use  . Smoking status: Never Smoker  . Smokeless tobacco: Never Used  Vaping Use  . Vaping Use: Never used  Substance Use Topics  . Alcohol use: Not Currently    Comment: occasional  . Drug use: Never    Home Medications Prior to Admission medications   Medication Sig Start Date End Date Taking? Authorizing Provider  allopurinol (ZYLOPRIM) 100 MG tablet Take 200 mg by mouth daily.   Yes [provider]  amLODipine (NORVASC) 5 MG tablet Take 1 tablet (5 mg total) by mouth daily. 11/22/19 02/20/20 Yes Buford Dresser, MD  apixaban (ELIQUIS) 2.5 MG TABS tablet Take 2.5 mg by mouth 2 (two) times daily.   Yes [provider]  busPIRone (BUSPAR) 7.5 MG tablet Take 7.5 mg by mouth 2 (two) times daily.   Yes [provider]  carvedilol (COREG) 12.5 MG tablet Take  18.75 mg by mouth daily.   Yes [provider]  cholecalciferol (VITAMIN D3) 25 MCG (1000 UNIT) tablet Take 1,000 Units by mouth daily.   Yes [provider]  colchicine 0.6 MG tablet Take 0.6 mg by mouth daily as needed (gout flare up). For gout flare   Yes [provider]  dicyclomine (BENTYL) 20 MG tablet Take 20 mg by mouth in the morning, at noon, in the evening, and at bedtime. 11/04/19  Yes [provider]  docusate sodium (COLACE) 100 MG capsule Take 100 mg by mouth daily.   Yes [provider]  ondansetron (ZOFRAN) 4 MG  tablet Take 1 tablet (4 mg total) by mouth every 6 (six) hours as needed for nausea. 01/31/20  Yes Pokhrel, Laxman, MD  pantoprazole (PROTONIX) 40 MG tablet Take 40 mg by mouth daily.   Yes [provider]  phenylephrine-shark liver oil-mineral oil-petrolatum (PREPARATION H) 0.25-14-74.9 % rectal ointment Place 1 application rectally every 6 (six) hours as needed for hemorrhoids.   Yes [provider]  polyethylene glycol (MIRALAX / GLYCOLAX) 17 g packet Take 17 g by mouth daily as needed for moderate constipation.   Yes [provider]  predniSONE (DELTASONE) 1 MG tablet Take 2 mg by mouth daily. 11/07/19  Yes [provider]  predniSONE (DELTASONE) 5 MG tablet Take 5 mg by mouth daily. Take with two 1 mg tablets to equal 7 mg 11/06/19  Yes [provider]  senna-docusate (SENOKOT-S) 8.6-50 MG tablet Take 1 tablet by mouth at bedtime as needed for mild constipation. 01/31/20  Yes Pokhrel, Laxman, MD  vitamin B-12 (CYANOCOBALAMIN) 1000 MCG tablet Take 1,000 mcg by mouth daily.   Yes [provider]  zinc oxide 20 % ointment Apply 1 application topically See admin instructions. Every shift to buttocks   Yes [provider]    Allergies    Patient has no known allergies.  Review of Systems   Review of Systems  Unable to perform ROS: Mental status change    Physical Exam Updated Vital Signs BP 139/75   Pulse (!) 103   Temp 98.2 F (36.8 C) (Axillary)   Resp 18   SpO2 93%   Physical Exam Vitals and nursing note reviewed.  Constitutional:      Appearance: He is underweight and well-nourished.     Comments: Appears chronically ill. confused  HENT:     Head: Normocephalic and atraumatic.     Mouth/Throat:     Mouth: Mucous membranes are dry.  Eyes:     Extraocular Movements: EOM normal.     Conjunctiva/sclera: Conjunctivae normal.     Pupils: Pupils are equal, round, and reactive to light.  Cardiovascular:     Rate and  Rhythm: Normal rate and regular rhythm.     Pulses: Normal pulses and intact distal pulses.  Pulmonary:     Effort: Pulmonary effort is normal. No respiratory distress.     Breath sounds: Normal breath sounds. No wheezing.  Abdominal:     General: There is no distension.     Palpations: There is mass.     Tenderness: There is no abdominal tenderness. There is no guarding or rebound.     Comments: Mass of R lower abd. No ttp  Musculoskeletal:     Cervical back: Normal range of motion and neck supple.     Comments: No obvious deformity. Many contusions and skin tears across all extremities  Skin:    General: Skin is  warm and dry.     Capillary Refill: Capillary refill takes less than 2 seconds.  Neurological:     Mental Status: He is confused.     GCS: GCS eye subscore is 4. GCS verbal subscore is 4. GCS motor subscore is 5.     Comments: gcs 13. Alert to self.   Psychiatric:        Mood and Affect: Mood and affect normal.     ED Results / Procedures / Treatments   Labs (all labs ordered are listed, but only abnormal results are displayed) Labs Reviewed  CBC WITH DIFFERENTIAL/PLATELET - Abnormal; Notable for the following components:      Result Value   RBC 3.23 (*)    Hemoglobin 9.2 (*)    HCT 31.5 (*)    MCHC 29.2 (*)    RDW 17.2 (*)    Abs Immature Granulocytes 0.97 (*)    All other components within normal limits  COMPREHENSIVE METABOLIC PANEL - Abnormal; Notable for the following components:   Chloride 112 (*)    Glucose, Bld 114 (*)    BUN 55 (*)    Creatinine, Ser 2.33 (*)    Calcium 8.7 (*)    Total Protein 6.1 (*)    Albumin 2.5 (*)    Alkaline Phosphatase 162 (*)    Total Bilirubin 1.5 (*)    GFR, Estimated 26 (*)    All other components within normal limits  URINALYSIS, ROUTINE W REFLEX MICROSCOPIC - Abnormal; Notable for the following components:   APPearance HAZY (*)    Ketones, ur 5 (*)    All other components within normal limits  RESP PANEL BY  RT-PCR (FLU A&B, COVID) ARPGX2    EKG None  Radiology DG Chest 2 View  Result Date: 02/01/2020 CLINICAL DATA:  Patient had 3 unwitnessed falls in the last 24hrs. Cognitive and physical decline x 3 weeks. Recent dx of pancreatic CA. Pt's wife in room at assisted living tested COVID (+) EXAM: CHEST - 2 VIEW COMPARISON:  None. FINDINGS: Cardiomegaly. Low lung volumes. There are diffuse bilateral airspace opacities concerning for multifocal infection or edema. No pneumothorax or large pleural effusion. No acute finding in the visualized skeleton. IMPRESSION: Cardiomegaly. Diffuse bilateral airspace opacities which are nonspecific but concerning for multifocal infection or edema. Electronically Signed   By: Audie Pinto M.D.   On: 02/13/2020 11:33   CT Head Wo Contrast  Result Date: 01/31/2020 CLINICAL DATA:  Multiple falls. EXAM: CT HEAD WITHOUT CONTRAST CT CERVICAL SPINE WITHOUT CONTRAST TECHNIQUE: Multidetector CT imaging of the head and cervical spine was performed following the standard protocol without intravenous contrast. Multiplanar CT image reconstructions of the cervical spine were also generated. COMPARISON:  None. FINDINGS: CT HEAD FINDINGS Brain: Mild diffuse cortical atrophy is noted. No mass effect or midline shift is noted. Ventricular size is within normal limits. There is no evidence of mass lesion, hemorrhage or acute infarction. Vascular: No hyperdense vessel or unexpected calcification. Skull: Normal. Negative for fracture or focal lesion. Sinuses/Orbits: Right sphenoid sinusitis is noted. Other: None. CT CERVICAL SPINE FINDINGS Alignment: Normal. Skull base and vertebrae: No acute fracture. No primary bone lesion or focal pathologic process. Soft tissues and spinal canal: No prevertebral fluid or swelling. No visible canal hematoma. Disc levels: Severe degenerative disc disease is noted at C5-6 and C6-7. Upper chest: Multiple nodules are seen in the visualized lung fields  consistent with metastatic disease. Other: None. IMPRESSION: 1. Mild diffuse cortical atrophy. Right sphenoid  sinusitis. No acute intracranial abnormality seen. 2. Severe multilevel degenerative disc disease. No acute abnormality seen in the cervical spine. 3. Multiple nodules are noted in the visualized lung fields consistent with metastatic disease. Electronically Signed   By: Marijo Conception M.D.   On: 01/21/2020 11:13   CT Cervical Spine Wo Contrast  Result Date: 02/08/2020 CLINICAL DATA:  Multiple falls. EXAM: CT HEAD WITHOUT CONTRAST CT CERVICAL SPINE WITHOUT CONTRAST TECHNIQUE: Multidetector CT imaging of the head and cervical spine was performed following the standard protocol without intravenous contrast. Multiplanar CT image reconstructions of the cervical spine were also generated. COMPARISON:  None. FINDINGS: CT HEAD FINDINGS Brain: Mild diffuse cortical atrophy is noted. No mass effect or midline shift is noted. Ventricular size is within normal limits. There is no evidence of mass lesion, hemorrhage or acute infarction. Vascular: No hyperdense vessel or unexpected calcification. Skull: Normal. Negative for fracture or focal lesion. Sinuses/Orbits: Right sphenoid sinusitis is noted. Other: None. CT CERVICAL SPINE FINDINGS Alignment: Normal. Skull base and vertebrae: No acute fracture. No primary bone lesion or focal pathologic process. Soft tissues and spinal canal: No prevertebral fluid or swelling. No visible canal hematoma. Disc levels: Severe degenerative disc disease is noted at C5-6 and C6-7. Upper chest: Multiple nodules are seen in the visualized lung fields consistent with metastatic disease. Other: None. IMPRESSION: 1. Mild diffuse cortical atrophy. Right sphenoid sinusitis. No acute intracranial abnormality seen. 2. Severe multilevel degenerative disc disease. No acute abnormality seen in the cervical spine. 3. Multiple nodules are noted in the visualized lung fields consistent with  metastatic disease. Electronically Signed   By: Marijo Conception M.D.   On: 01/23/2020 11:13   DG Hips Bilat W or Wo Pelvis 3-4 Views  Result Date: 01/27/2020 CLINICAL DATA:  Fall x3 over the prior day, pancreatic cancer EXAM: DG HIP (WITH OR WITHOUT PELVIS) 3-4V BILAT COMPARISON:  01/29/2020 CT abdomen/pelvis FINDINGS: Right total hip arthroplasty with no evidence of hardware fracture or loosening. No pelvic fracture or diastasis. No osseous fracture or dislocation in either hip. No suspicious focal osseous lesions. Surgical clips overlie the deep pelvis bilaterally. Mild left hip osteoarthritis. IMPRESSION: No fracture or dislocation in either hip. Right total hip arthroplasty without hardware complication. Mild left hip osteoarthritis. Electronically Signed   By: Ilona Sorrel M.D.   On: 02/03/2020 11:35    Procedures Procedures (including critical care time)  Medications Ordered in ED Medications  acetaminophen (TYLENOL) tablet 650 mg (has no administration in time range)    Or  acetaminophen (TYLENOL) suppository 650 mg (has no administration in time range)  haloperidol lactate (HALDOL) injection 0.5 mg (has no administration in time range)  ondansetron (ZOFRAN-ODT) disintegrating tablet 4 mg (has no administration in time range)    Or  ondansetron (ZOFRAN) injection 4 mg (has no administration in time range)  glycopyrrolate (ROBINUL) tablet 1 mg (has no administration in time range)    Or  glycopyrrolate (ROBINUL) injection 0.2 mg (has no administration in time range)    Or  glycopyrrolate (ROBINUL) injection 0.2 mg (has no administration in time range)  antiseptic oral rinse (BIOTENE) solution 15 mL (has no administration in time range)  polyvinyl alcohol (LIQUIFILM TEARS) 1.4 % ophthalmic solution 1 drop (has no administration in time range)  sodium chloride flush (NS) 0.9 % injection 3 mL (has no administration in time range)  sodium chloride flush (NS) 0.9 % injection 3 mL (has no  administration in time range)  0.9 %  sodium chloride infusion (has no administration in time range)  HYDROmorphone (DILAUDID) injection 0.5 mg (has no administration in time range)  HYDROmorphone (DILAUDID) injection 0.25 mg (has no administration in time range)  haloperidol lactate (HALDOL) injection 1 mg (has no administration in time range)    ED Course  I have reviewed the triage vital signs and the nursing notes.  Pertinent labs & imaging results that were available during my care of the patient were reviewed by me and considered in my medical decision making (see chart for details).    MDM Rules/Calculators/A&P                          Patient presenting for evaluation of worsening mental status, weakness, and frequent falls.  He was recently admitted for dehydration, imaging at that time showed likely terminal metastatic cancer.  This has not been confirmed with a biopsy, and no invasive procedures are planned at this time as patient is under palliative care.  Since discharge from the hospital, patient has continued to decline, with multiple falls in the past day, which prompted his ER visit.  On evaluation, patient appears dehydrated.  He is not saying much, cognition is poor.  Discussed with son, who states this has been the trend they have seen, worsening mental status.  He is requesting that his father be comfortable, but has no other goals at this time.  He is currently a resident at an assisted living facility, not part of skilled care.  He is under palliative care, will consult palliative.  Will obtain labs and CT imaging.  CT head and neck negative for acute findings.  Chest and pelvis x-rays negative.  Labs interpreted by me, overall reassuring.  He is mildly dehydrated, though improved from his recent admission.  Discussed with facility, unfortunately patient will not be able to get a skilled nursing bed if he is discharged from the hospital today.  Discussed with Florentina Jenny from palliative care, who discussed situation with son and hospice.  There is awaiting with hospice, patient will not be able to be discharged in the ER to a hospice bed.  As such, she request patient be admitted to the hospital for comfort care and end-of-life care. Will call for admission.   Discussed with Dr. Luna Fuse from [facility] hospital[facility] service, pt to be admitted.   Final Clinical Impression(s) / ED Diagnoses Final diagnoses:  End of life care    Rx / DC Orders ED Discharge Orders    None       Franchot Heidelberg, PA-C 02/01/2020 1336    Daleen Bo, MD 01/25/2020 1458

## 2020-02-02 NOTE — ED Provider Notes (Signed)
  Face-to-face evaluation   History: Patient presents from his assisted living facility where he lives with his wife in a room together.  She apparently has COVID and he has tested negative.  Caregivers were concerned about falling, with possible injury so they sent him here.  Recently diagnosed with probable metastatic pancreatic cancer.  Palliative care is being explored.  Physical exam: Alert elderly male.  He is repetitively asking "where is my wife."  No visible injury to the head.  No dysarthria or aphasia.  No obvious extremity injury.  Abdomen is mildly distended and tender.  Chest wall is nontender.  Medical screening examination/treatment/procedure(s) were conducted as a shared visit with non-physician practitioner(s) and myself.  I personally evaluated the patient during the encounter    Daleen Bo, MD 01/24/2020 [phone]

## 2020-02-02 NOTE — Consult Note (Incomplete)
Consultation Note Date: 02/06/2020   Patient Name: Joseph Leach  DOB: 06/22/1925  MRN: [id number]  Age / Sex: 87 y.o., male  PCP: Maurice Small, MD Referring Physician: Daleen Bo, MD  Reason for Consultation: {Reason for Consult:23484}  HPI/Patient Profile: 87 y.o. male  with past medical history of *** who was admitted on 01/20/2020 with ***.   Clinical Assessment and Goals of Care: ***  Primary Decision Maker:  {Primary Decision YMEBR:83094}    SUMMARY OF RECOMMENDATIONS    Code Status/Advance Care Planning:     Symptom Management:   ***  Additional Recommendations (Limitations, Scope, Preferences):  {Recommended Scope and Preferences:21019}  Palliative Prophylaxis:   {Palliative Prophylaxis:21015}  Psycho-social/Spiritual:   Desire for further Chaplaincy support:  Prognosis:     Discharge Planning: {Palliative dispostion:23505}      Primary Diagnoses: Present on Admission: **None**   I have reviewed the medical record, interviewed the patient and family, and examined the patient. The following aspects are pertinent.  Past Medical History:  Diagnosis Date  . Hypertension   . SCC (squamous cell carcinoma) 08/02/2019   RIGHT FOREARM  (TX WITH BX)  . SCC (squamous cell carcinoma) 08/02/2019   LEFT FOREARM  (TX WITH BX)  . Squamous cell carcinoma of skin 08/08/2017   in situ-left forearm (txpbx)  . Squamous cell carcinoma of skin 01/09/2019   in situ-left forearm (txpbx)  . Squamous cell carcinoma of skin 01/09/2019   in situ-right hand proximal (txpbx)  . Squamous cell carcinoma of skin 01/09/2019   in situ-right arm (txpbx)   Social History   Socioeconomic History  . Marital status: Married    Spouse name: Not on file  . Number of children: Not on file  . Years of education: Not on file  . Highest education level: Not on file  Occupational History   . Not on file  Tobacco Use  . Smoking status: Never Smoker  . Smokeless tobacco: Never Used  Vaping Use  . Vaping Use: Never used  Substance and Sexual Activity  . Alcohol use: Not Currently    Comment: occasional  . Drug use: Never  . Sexual activity: Not on file  Other Topics Concern  . Not on file  Social History Narrative  . Not on file   Social Determinants of Health   Financial Resource Strain: Not on file  Food Insecurity: Not on file  Transportation Needs: Not on file  Physical Activity: Not on file  Stress: Not on file  Social Connections: Not on file   History reviewed. No pertinent family history.  No Known Allergies  Review of Systems   Physical Exam   Vital Signs: BP 139/75   Pulse (!) 103   Temp 98.2 F (36.8 C) (Axillary)   Resp 18   SpO2 93%          SpO2: SpO2: 93 % O2 Device:SpO2: 93 % O2 Flow Rate: .     Palliative Assessment/Data:     Time In: *** Time Out: ***  Time Total: *** Visit consisted of counseling and education dealing with the complex and emotionally intense issues surrounding the need for palliative care and symptom management in the setting of serious and potentially life-threatening illness. Greater than 50%  of this time was spent counseling and coordinating care related to the above assessment and plan.  Signed by: Florentina Jenny, PA-C Palliative Medicine  Please contact Palliative Medicine Team phone at [phone] for questions and concerns.  For individual provider: See Shea Evans

## 2020-02-02 NOTE — Consult Note (Signed)
Consultation Note Date: 02/05/2020   Patient Name: Joseph Leach  DOB: 1925-09-28  MRN: [id number]  Age / Sex: 87 y.o., male  PCP: Maurice Small, MD Referring Physician: Daleen Bo, MD  Reason for Consultation: Establishing goals of care  HPI/Patient Profile: 87 y.o. male  with past medical history of gout, osteoarthritis, and recently diagnosed wide spread metastatic cancer of unknown origin who was admitted on 02/12/2020 with 3 falls in 24 hours.  His family describes him as agitated, disorientated, and not comprehending what is going on around him.  He has been eating very little and in the last few days has stopped eating completely.  His son estimates that his weight has dropped from 195 to 180 recently.  Mr. Sexson lives at [facility] in a room with his wife.  Unfortunately she just tested positive for COVID.  Patient was very recently discharged after his metastatic cancer diagnosis and was to have Hospice services at home.  Hospice had scheduled their intake appointment for Tuesday 1/18.  Clinical Assessment and Goals of Care:  I have reviewed medical records including EPIC notes, labs and imaging, received report from the ER provider, and spoke with his son [name] as well as his ALF RN  to discuss diagnosis prognosis, GOC, EOL wishes, disposition and options.  I introduced Palliative Medicine as specialized medical care for people living with serious illness. It focuses on providing relief from the symptoms and stress of a serious illness.   We discussed a brief life review of the patient.  He and his wife moved to [place] from [place] when their health was starting to decline to be near their son [name].  They have lived with Les for the past 4 years until they moved into [facility].    As far as functional and nutritional status the patient was able to get around but did have  two aids helping he and his wife at [place].  Recently he has become very confused, completely lost the strength in his legs and stopped eating.  We discussed his current illness and what it means in the larger context of his on-going co-morbidities.  [name] was told by the doctor at the time of his father's cancer diagnosis that this cancer may progress very quickly.  The family opted not to pursue PET scan, biopsy or further work up.  Natural disease trajectory and expectations at EOL were discussed.  I attempted to elicit values and goals of care important to the patient.  The difference between aggressive medical intervention and comfort care was considered in light of the patient's goals of care.  Les and his outpatient RN feel that Mr. Dyke is likely facing end of life. We discussed prognosis.  Les understands that I have not seen the patient as we are having a severe snow storm today and I am working from home.  I explained to Les if the symptoms he and Jonelle Sidle are describing are accurate then his prognosis is likely only days.  After speaking with Hospice we learned that [facility] has no beds today.  The family would be grateful for an admission for end of life care until a bed at [facility] becomes available.    Questions and concerns were addressed.  The family was encouraged to call with questions or concerns.        Primary Decision Maker:  NEXT OF KIN Son Alfredo Spong    SUMMARY OF RECOMMENDATIONS    Admission for comfort measures only until [facility] has a bed available. Hydromorphone scheduled and PRN for pain Haldol scheduled and PRN for agitation and anxiety. PMT will check in with RN later today to determine if symptoms are being well managed.  Code Status/Advance Care Planning:  DNR   Symptom Management:   As above  Additional Recommendations (Limitations, Scope, Preferences):  Full Comfort Care  Palliative Prophylaxis:   Aspiration, Delirium Protocol  and Frequent Pain Assessment  Psycho-social/Spiritual:   Desire for further Chaplaincy support: not discussed  Prognosis:   Hours to days.  Discharge Planning: Hospice facility      Primary Diagnoses: Present on Admission: **None**   I have reviewed the medical record, interviewed the patient and family, and examined the patient. The following aspects are pertinent.  Past Medical History:  Diagnosis Date  . Hypertension   . SCC (squamous cell carcinoma) 08/02/2019   RIGHT FOREARM  (TX WITH BX)  . SCC (squamous cell carcinoma) 08/02/2019   LEFT FOREARM  (TX WITH BX)  . Squamous cell carcinoma of skin 08/08/2017   in situ-left forearm (txpbx)  . Squamous cell carcinoma of skin 01/09/2019   in situ-left forearm (txpbx)  . Squamous cell carcinoma of skin 01/09/2019   in situ-right hand proximal (txpbx)  . Squamous cell carcinoma of skin 01/09/2019   in situ-right arm (txpbx)   Social History   Socioeconomic History  . Marital status: Married    Spouse name: Not on file  . Number of children: Not on file  . Years of education: Not on file  . Highest education level: Not on file  Occupational History  . Not on file  Tobacco Use  . Smoking status: Never Smoker  . Smokeless tobacco: Never Used  Vaping Use  . Vaping Use: Never used  Substance and Sexual Activity  . Alcohol use: Not Currently    Comment: occasional  . Drug use: Never  . Sexual activity: Not on file  Other Topics Concern  . Not on file  Social History Narrative  . Not on file   Social Determinants of Health   Financial Resource Strain: Not on file  Food Insecurity: Not on file  Transportation Needs: Not on file  Physical Activity: Not on file  Stress: Not on file  Social Connections: Not on file   History reviewed. No pertinent family history.  No Known Allergies    Vital Signs: BP 139/75   Pulse (!) 103   Temp 98.2 F (36.8 C) (Axillary)   Resp 18   SpO2 93%           SpO2: SpO2: 93 % O2 Device:SpO2: 93 % O2 Flow Rate: .     Palliative Assessment/Data: 20%     Time In: 12:00 Time Out: 1:00 Time Total: 60 min Visit consisted of counseling and education dealing with the complex and emotionally intense issues surrounding the need for palliative care and symptom management in the setting of serious and potentially life-threatening illness. Greater than 50%  of  this time was spent counseling and coordinating care related to the above assessment and plan.  Signed by: Florentina Jenny, PA-C Palliative Medicine  Please contact Palliative Medicine Team phone at [phone] for questions and concerns.  For individual provider: See Shea Evans

## 2020-02-02 NOTE — ED Notes (Signed)
Pt removed all cardiac and spo2 monitoring, removed IV, not agitated and attempted to strike staff. New IV established. Safety mits placed for pt safety. Medicated as ordered. Bed alarm on.

## 2020-02-02 NOTE — Progress Notes (Addendum)
Engineer, maintenance [facility]) Hospital Liaison note.  Received request from [facility] for family interest in [facility]. [facility] is unable to offer a room today. Hospice eligibility approved for BP by [facility] MD.    Damaris Schooner to son [name], who is aware of fathers condition. States his father is from [place] who originally lived with his wife as roommate. He has declined over the last month or so and is requiring more care. [name] has [facility] numbers for any further questions or concerns.  Hospital Liaison will follow up tomorrow or sooner if a room becomes available.   Please do not hesitate to call with questions.   Thank you,  Clementeen Hoof, BSN, [facility] [phone]

## 2020-02-02 NOTE — ED Notes (Signed)
Pt place on male primofit.

## 2020-02-02 NOTE — ED Notes (Signed)
Pt observed going through spells of apnea indicative of potential end of life. Charge RN and attending MD notified. Vital signs documented. Patient is DNR, nurse providing comfort care at bedside.

## 2020-02-02 NOTE — H&P (Signed)
History and Physical  Patient Name: Joseph Leach     DTO:[id number]    DOB: 21-Apr-1925    DOA: 02/06/2020 PCP: Maurice Small, MD  Patient coming from: Skilled nursing facility  Chief Complaint: Worsening encephalopathy, progressive failure to thrive    HPI: Joseph Leach is a 87 y.o. male with hx of anemia, A. fib, essential hypertension, metastatic disease (unknown origin), failure to thrive, CKD 4, who presented to the hospital on 02/03/2020 secondary to worsening encephalopathy and rapidly progressing functional status.  In the time exam, patient is severely encephalopathic, with no family bedside, thus a complete and thorough HPI and review of systems unable to be obtained.  History obtained from chart and calling son, Joseph Leach.  She was just recently discharged on 01/31/2020 when he was admitted for AKI and progressing weakness.  During hospitalization it was noted he had findings highly consistent with metastatic disease.  Imaging showed likely mets to the liver, lungs, and skeletal lesions.  He was evaluated by oncology however family elected to avoid invasive measures, pursue hospice, and thus a biopsy for determining etiology was deferred.  Plan was to transition to hospice after discharge.  However patient's condition rapidly progressive at nursing facility, continue to get weaker, more encephalopathic, poor p.o. intake, and agitated.  They have been in contact with hospice but unfortunately no beds were available.  Thus family brought him to the hospital to help provide the adequate needed end-of-life support.  With plans to discharge to hospice once bed available if patient does not pass prior.  ED course: -Vitals initial presentation, afebrile, heart rate 82, respiratory rate 25, blood pressure 121/75 -Relevant labs on admission: Sodium 144, potassium 3.6, chloride 112, bicarb 22, BUN 55, creatinine 2.33, albumin 2.5, WBC 10.2, hemoglobin 9.2. -Palliative care was consulted in the  ED.  Comfort measures ordered.  Planning for hospice when bed available -The hospital service was contacted for admission     ROS: Unable to fully obtain due to patient's status      Past Medical History:  Diagnosis Date  . Hypertension   . SCC (squamous cell carcinoma) 08/02/2019   RIGHT FOREARM  (TX WITH BX)  . SCC (squamous cell carcinoma) 08/02/2019   LEFT FOREARM  (TX WITH BX)  . Squamous cell carcinoma of skin 08/08/2017   in situ-left forearm (txpbx)  . Squamous cell carcinoma of skin 01/09/2019   in situ-left forearm (txpbx)  . Squamous cell carcinoma of skin 01/09/2019   in situ-right hand proximal (txpbx)  . Squamous cell carcinoma of skin 01/09/2019   in situ-right arm (txpbx)    Past Surgical History:  Procedure Laterality Date  . APPENDECTOMY    . LIGAMENT REPAIR Right   . PROSTATECTOMY    . TOTAL HIP ARTHROPLASTY      Social History: Patient lives at nursing facility.  The patient walks with a walker.    No Known Allergies  Family history: family history is not on file.  Prior to Admission medications   Medication Sig Start Date End Date Taking? Authorizing Provider  allopurinol (ZYLOPRIM) 100 MG tablet Take 200 mg by mouth daily.   Yes [provider]  amLODipine (NORVASC) 5 MG tablet Take 1 tablet (5 mg total) by mouth daily. 11/22/19 02/20/20 Yes Buford Dresser, MD  apixaban (ELIQUIS) 2.5 MG TABS tablet Take 2.5 mg by mouth 2 (two) times daily.   Yes [provider]  busPIRone (BUSPAR) 7.5 MG tablet Take 7.5 mg by mouth 2 (  two) times daily.   Yes [provider]  carvedilol (COREG) 12.5 MG tablet Take 18.75 mg by mouth daily.   Yes [provider]  cholecalciferol (VITAMIN D3) 25 MCG (1000 UNIT) tablet Take 1,000 Units by mouth daily.   Yes [provider]  colchicine 0.6 MG tablet Take 0.6 mg by mouth daily as needed (gout flare up). For gout flare   Yes [provider]  dicyclomine  (BENTYL) 20 MG tablet Take 20 mg by mouth in the morning, at noon, in the evening, and at bedtime. 11/04/19  Yes [provider]  docusate sodium (COLACE) 100 MG capsule Take 100 mg by mouth daily.   Yes [provider]  ondansetron (ZOFRAN) 4 MG tablet Take 1 tablet (4 mg total) by mouth every 6 (six) hours as needed for nausea. 01/31/20  Yes Pokhrel, Laxman, MD  pantoprazole (PROTONIX) 40 MG tablet Take 40 mg by mouth daily.   Yes [provider]  phenylephrine-shark liver oil-mineral oil-petrolatum (PREPARATION H) 0.25-14-74.9 % rectal ointment Place 1 application rectally every 6 (six) hours as needed for hemorrhoids.   Yes [provider]  polyethylene glycol (MIRALAX / GLYCOLAX) 17 g packet Take 17 g by mouth daily as needed for moderate constipation.   Yes [provider]  predniSONE (DELTASONE) 1 MG tablet Take 2 mg by mouth daily. 11/07/19  Yes [provider]  predniSONE (DELTASONE) 5 MG tablet Take 5 mg by mouth daily. Take with two 1 mg tablets to equal 7 mg 11/06/19  Yes [provider]  senna-docusate (SENOKOT-S) 8.6-50 MG tablet Take 1 tablet by mouth at bedtime as needed for mild constipation. 01/31/20  Yes Pokhrel, Laxman, MD  vitamin B-12 (CYANOCOBALAMIN) 1000 MCG tablet Take 1,000 mcg by mouth daily.   Yes [provider]  zinc oxide 20 % ointment Apply 1 application topically See admin instructions. Every shift to buttocks   Yes [provider]       Physical Exam: BP 112/84 (BP Location: Right Arm)   Pulse 96   Temp 98.2 F (36.8 C) (Axillary)   Resp (!) 22   SpO2 96%  General appearance: Extremely frail elderly male, encephalopathic Eyes: Anicteric, PERRL.    ENT: No nasal deformity, discharge, epistaxis.  Hearing poor. OP dry Neck: No neck masses.  Trachea midline.  No thyromegaly/tenderness. Lymph: No cervical or supraclavicular lymphadenopathy. Skin: Warm and dry.  No jaundice.   Cardiac: Irregularly irregular, nl S1-S2, no murmurs appreciated.  .  No LE edema.  Radial and pedal pulses 2+ and symmetric. Respiratory: Coarse breath sounds bilaterally, no wheezing Abdomen: Abdomen soft.  Nontender to palpitation. No ascites, distension, hepatosplenomegaly.   MSK: Multiple bruising, muscle atrophy Neuro: Difficult to fully assess due to patient's status    Psych: Encephalopathic, slightly agitated     Labs on Admission:  I have personally reviewed following labs and imaging studies: CBC: Recent Labs  Lab 01/29/20 1951 01/30/20 0554 01/31/20 0613 02/01/2020 1011  WBC 8.9 9.3 8.9 10.2  NEUTROABS 6.0  --   --  6.9  HGB 9.0* 8.5* 8.7* 9.2*  HCT 29.0* 28.4* 29.7* 31.5*  MCV 96.0 96.3 97.4 97.5  PLT 176 199 176 650   Basic Metabolic Panel: Recent Labs  Lab 01/29/20 1951 01/30/20 0554 01/31/20 0613 01/31/2020 1011  NA 139 140 143 144  K 4.3 3.4* 4.3 3.6  CL 106 107 111 112*  CO2 22 22 20* 22  GLUCOSE 122* 98 85 114*  BUN 66* 58* 54* 55*  CREATININE 2.99* 2.63* 2.38* 2.33*  CALCIUM 8.8* 8.5* 8.7* 8.7*   GFR: Estimated Creatinine Clearance: 20.2 mL/min (A) (by C-G formula based on SCr of 2.33 mg/dL (H)).  Liver Function Tests: Recent Labs  Lab 01/29/20 1951 02/15/2020 1011  AST 27 22  ALT 17 16  ALKPHOS 173* 162*  BILITOT 0.6 1.5*  PROT 5.9* 6.1*  ALBUMIN 2.6* 2.5*   No results for input(s): LIPASE, AMYLASE in the last 168 hours. Recent Labs  Lab 01/30/20 0554  AMMONIA 16   Coagulation Profile: No results for input(s): INR, PROTIME in the last 168 hours. Cardiac Enzymes: No results for input(s): CKTOTAL, CKMB, CKMBINDEX, TROPONINI in the last 168 hours. BNP (last 3 results) No results for input(s): PROBNP in the last 8760 hours. HbA1C: No results for input(s): HGBA1C in the last 72 hours. CBG: No results for input(s): GLUCAP in the last 168 hours. Lipid Profile: No results for input(s): CHOL, HDL, LDLCALC, TRIG, CHOLHDL, LDLDIRECT in  the last 72 hours. Thyroid Function Tests: No results for input(s): TSH, T4TOTAL, FREET4, T3FREE, THYROIDAB in the last 72 hours. Anemia Panel: No results for input(s): VITAMINB12, FOLATE, FERRITIN, TIBC, IRON, RETICCTPCT in the last 72 hours. Sepsis Labs:   Recent Results (from the past 240 hour(s))  Urine culture     Status: None   Collection Time: 01/29/20  9:20 PM   Specimen: Urine, Random  Result Value Ref Range Status   Specimen Description   Final    URINE, RANDOM Performed at [facility] [address], [place], [place] [address]    Special Requests   Final    NONE Performed at [facility], [place] [address], [place], [place] [address]    Culture   Final    NO GROWTH Performed at [facility], [place] [address], [place], [place] [address]    Report Status 01/31/2020 FINAL  Final  SARS CORONAVIRUS 2 (TAT 6-24 HRS) Nasopharyngeal Nasopharyngeal Swab     Status: None   Collection Time: 01/29/20 11:50 PM   Specimen: Nasopharyngeal Swab  Result Value Ref Range Status   SARS Coronavirus 2 NEGATIVE NEGATIVE Final    Comment: (NOTE) SARS-CoV-2 target nucleic acids are NOT DETECTED.  The SARS-CoV-2 RNA is generally detectable in upper and lower respiratory specimens during the acute phase of infection. Negative results do not preclude SARS-CoV-2 infection, do not rule out co-infections with other pathogens, and should not be used as the sole basis for treatment or other patient management decisions. Negative results must be combined with clinical observations, patient history, and epidemiological information. The expected result is Negative.  Fact Sheet for Patients: SugarRoll.be  Fact Sheet for Healthcare Providers: https://www.woods-mathews.com/  This test is not yet approved or cleared by the [place] FDA and  has been authorized for detection and/or diagnosis of SARS-CoV-2  by FDA under an Emergency Use Authorization (EUA). This EUA will remain  in effect (meaning this test can be used) for the duration of the COVID-19 declaration under Se ction 564(b)(1) of the Act, 21 U.S.C. section 360bbb-3(b)(1), unless the authorization is terminated or revoked sooner.  Performed at [facility], [place] [address] [place], [place] [address]   Resp Panel by RT-PCR (Flu A&B, Covid)     Status: None   Collection Time: 02/13/2020 12:40 PM   Specimen: Nasopharyngeal(NP) swabs in vial transport medium  Result Value Ref Range Status   SARS Coronavirus 2 by  RT PCR NEGATIVE NEGATIVE Final    Comment: (NOTE) SARS-CoV-2 target nucleic acids are NOT DETECTED.  The SARS-CoV-2 RNA is generally detectable in upper respiratory specimens during the acute phase of infection. The lowest concentration of SARS-CoV-2 viral copies this assay can detect is 138 copies/mL. A negative result does not preclude SARS-Cov-2 infection and should not be used as the sole basis for treatment or other patient management decisions. A negative result may occur with  improper specimen collection/handling, submission of specimen other than nasopharyngeal swab, presence of viral mutation(s) within the areas targeted by this assay, and inadequate number of viral copies(<138 copies/mL). A negative result must be combined with clinical observations, patient history, and epidemiological information. The expected result is Negative.  Fact Sheet for Patients:  EntrepreneurPulse.com.au  Fact Sheet for Healthcare Providers:  IncredibleEmployment.be  This test is no t yet approved or cleared by the [place] FDA and  has been authorized for detection and/or diagnosis of SARS-CoV-2 by FDA under an Emergency Use Authorization (EUA). This EUA will remain  in effect (meaning this test can be used) for the duration of the COVID-19 declaration under Section 564(b)(1) of  the Act, 21 U.S.C.section 360bbb-3(b)(1), unless the authorization is terminated  or revoked sooner.       Influenza A by PCR NEGATIVE NEGATIVE Final   Influenza B by PCR NEGATIVE NEGATIVE Final    Comment: (NOTE) The Xpert Xpress SARS-CoV-2/FLU/RSV plus assay is intended as an aid in the diagnosis of influenza from Nasopharyngeal swab specimens and should not be used as a sole basis for treatment. Nasal washings and aspirates are unacceptable for Xpert Xpress SARS-CoV-2/FLU/RSV testing.  Fact Sheet for Patients: EntrepreneurPulse.com.au  Fact Sheet for Healthcare Providers: IncredibleEmployment.be  This test is not yet approved or cleared by the [place] FDA and has been authorized for detection and/or diagnosis of SARS-CoV-2 by FDA under an Emergency Use Authorization (EUA). This EUA will remain in effect (meaning this test can be used) for the duration of the COVID-19 declaration under Section 564(b)(1) of the Act, 21 U.S.C. section 360bbb-3(b)(1), unless the authorization is terminated or revoked.  Performed at [facility], [place] [address], [place], [place] [address]            Radiological Exams on Admission: Personally reviewed imaging: Chest x-ray concerning for bilateral airspace disease CT head showed findings consistent with age-related changes, no acute processes. CT cervical spine showed findings consistent with metastatic disease in the lung. DG Chest 2 View  Result Date: 01/29/2020 CLINICAL DATA:  Patient had 3 unwitnessed falls in the last 24hrs. Cognitive and physical decline x 3 weeks. Recent dx of pancreatic CA. Pt's wife in room at assisted living tested COVID (+) EXAM: CHEST - 2 VIEW COMPARISON:  None. FINDINGS: Cardiomegaly. Low lung volumes. There are diffuse bilateral airspace opacities concerning for multifocal infection or edema. No pneumothorax or large pleural effusion. No acute finding in  the visualized skeleton. IMPRESSION: Cardiomegaly. Diffuse bilateral airspace opacities which are nonspecific but concerning for multifocal infection or edema. Electronically Signed   By: Audie Pinto M.D.   On: 01/30/2020 11:33   CT Head Wo Contrast  Result Date: 02/15/2020 CLINICAL DATA:  Multiple falls. EXAM: CT HEAD WITHOUT CONTRAST CT CERVICAL SPINE WITHOUT CONTRAST TECHNIQUE: Multidetector CT imaging of the head and cervical spine was performed following the standard protocol without intravenous contrast. Multiplanar CT image reconstructions of the cervical spine were also generated. COMPARISON:  None. FINDINGS: CT HEAD FINDINGS Brain:  Mild diffuse cortical atrophy is noted. No mass effect or midline shift is noted. Ventricular size is within normal limits. There is no evidence of mass lesion, hemorrhage or acute infarction. Vascular: No hyperdense vessel or unexpected calcification. Skull: Normal. Negative for fracture or focal lesion. Sinuses/Orbits: Right sphenoid sinusitis is noted. Other: None. CT CERVICAL SPINE FINDINGS Alignment: Normal. Skull base and vertebrae: No acute fracture. No primary bone lesion or focal pathologic process. Soft tissues and spinal canal: No prevertebral fluid or swelling. No visible canal hematoma. Disc levels: Severe degenerative disc disease is noted at C5-6 and C6-7. Upper chest: Multiple nodules are seen in the visualized lung fields consistent with metastatic disease. Other: None. IMPRESSION: 1. Mild diffuse cortical atrophy. Right sphenoid sinusitis. No acute intracranial abnormality seen. 2. Severe multilevel degenerative disc disease. No acute abnormality seen in the cervical spine. 3. Multiple nodules are noted in the visualized lung fields consistent with metastatic disease. Electronically Signed   By: Marijo Conception M.D.   On: 02/17/2020 11:13   CT Cervical Spine Wo Contrast  Result Date: 02/15/2020 CLINICAL DATA:  Multiple falls. EXAM: CT HEAD WITHOUT  CONTRAST CT CERVICAL SPINE WITHOUT CONTRAST TECHNIQUE: Multidetector CT imaging of the head and cervical spine was performed following the standard protocol without intravenous contrast. Multiplanar CT image reconstructions of the cervical spine were also generated. COMPARISON:  None. FINDINGS: CT HEAD FINDINGS Brain: Mild diffuse cortical atrophy is noted. No mass effect or midline shift is noted. Ventricular size is within normal limits. There is no evidence of mass lesion, hemorrhage or acute infarction. Vascular: No hyperdense vessel or unexpected calcification. Skull: Normal. Negative for fracture or focal lesion. Sinuses/Orbits: Right sphenoid sinusitis is noted. Other: None. CT CERVICAL SPINE FINDINGS Alignment: Normal. Skull base and vertebrae: No acute fracture. No primary bone lesion or focal pathologic process. Soft tissues and spinal canal: No prevertebral fluid or swelling. No visible canal hematoma. Disc levels: Severe degenerative disc disease is noted at C5-6 and C6-7. Upper chest: Multiple nodules are seen in the visualized lung fields consistent with metastatic disease. Other: None. IMPRESSION: 1. Mild diffuse cortical atrophy. Right sphenoid sinusitis. No acute intracranial abnormality seen. 2. Severe multilevel degenerative disc disease. No acute abnormality seen in the cervical spine. 3. Multiple nodules are noted in the visualized lung fields consistent with metastatic disease. Electronically Signed   By: Marijo Conception M.D.   On: 01/25/2020 11:13   DG Hips Bilat W or Wo Pelvis 3-4 Views  Result Date: 02/10/2020 CLINICAL DATA:  Fall x3 over the prior day, pancreatic cancer EXAM: DG HIP (WITH OR WITHOUT PELVIS) 3-4V BILAT COMPARISON:  01/29/2020 CT abdomen/pelvis FINDINGS: Right total hip arthroplasty with no evidence of hardware fracture or loosening. No pelvic fracture or diastasis. No osseous fracture or dislocation in either hip. No suspicious focal osseous lesions. Surgical clips  overlie the deep pelvis bilaterally. Mild left hip osteoarthritis. IMPRESSION: No fracture or dislocation in either hip. Right total hip arthroplasty without hardware complication. Mild left hip osteoarthritis. Electronically Signed   By: Ilona Sorrel M.D.   On: 01/25/2020 11:35          Assessment/Plan   1.  Failure to thrive 2.  Metastatic cancer 3.  Recurrent falls 4.  Poor p.o. intake 5.  Comfort measures 6.  Deconditioned 7.  Protein malnutrition 8.  CKD 4 9.  Worsening metabolic encephalopathy  Patient has recently diagnosed metastatic malignancy, unknown origin, and the family has elected for comfort/conservative therapy only.  Plan was to get hospice set up however condition has progressed rapidly, and with no hospice beds currently available, patient is requiring admission to inpatient for adequate end-of-life care.  -Comfort measures only.  As needed medication for agitation and comfort - Palliative care consulted and following -Hospice following, plan for discharge once bed available -Avoid unnecessary wires/monitoring, lab draws in the sake of comfort - Discontinuing home medications that won't contribute to comfort     DVT prophylaxis: None Code Status: DNR Family Communication: Discussed with patient's son, Joseph Leach Disposition Plan: Anticipate discharge to hospice Consults called: Palliative care called by ED Admission status: Inpatient      Medical decision making: Patient seen at 6:29 PM on 01/22/2020.  The patient was discussed with son via the phone.  What exists of the patient's chart was reviewed in depth and summarized above.  Clinical condition: Extremely poor.        Doran Heater [facility] Please page though [place] or Epic secure chat:  For password, contact charge nurse

## 2020-02-02 NOTE — ED Notes (Signed)
Pt repositioned and provided with comfort care with the help of NT.

## 2020-02-02 NOTE — ED Triage Notes (Addendum)
Pt arrived via EMS, from [facility], has had falls x3 unwitnessed in the last 24hrs. On blood thinners. Cognitive and physical decline x3 weeks. Recent dx of pancreatic CA, per EMS, pt not aware.  Pt in room with wife as assisted living, pt wife tested COVID (+)

## 2020-02-03 DIAGNOSIS — Z515 Encounter for palliative care: Secondary | ICD-10-CM | POA: Diagnosis not present

## 2020-02-03 MED ORDER — HYDROMORPHONE BOLUS VIA INFUSION
0.5000 mg | INTRAVENOUS | Status: DC | PRN
  Filled 2020-02-03: qty 1

## 2020-02-03 MED ORDER — SODIUM CHLORIDE 0.9 % IV SOLN
0.5000 mg/h | INTRAVENOUS | Status: DC
  Administered 2020-02-03: 0.5 mg/h via INTRAVENOUS
  Filled 2020-02-03 (×2): qty 2.5

## 2020-02-03 MED ORDER — HYDROMORPHONE HCL 1 MG/ML IJ SOLN
1.0000 mg | INTRAMUSCULAR | Status: AC
  Administered 2020-02-03: 1 mg via INTRAVENOUS
  Filled 2020-02-03: qty 1

## 2020-02-03 MED ORDER — HALOPERIDOL LACTATE 5 MG/ML IJ SOLN: 1.0000 mg | Freq: Four times a day (QID) | INTRAMUSCULAR | Status: DC | PRN

## 2020-02-03 MED ORDER — HALOPERIDOL LACTATE 5 MG/ML IJ SOLN
1.0000 mg | Freq: Four times a day (QID) | INTRAMUSCULAR | Status: DC
  Administered 2020-02-03 – 2020-02-04 (×4): 1 mg via INTRAVENOUS
  Filled 2020-02-03 (×4): qty 1

## 2020-02-03 MED ORDER — HALOPERIDOL LACTATE 5 MG/ML IJ SOLN: 1.0000 mg | INTRAMUSCULAR | Status: DC | PRN

## 2020-02-03 NOTE — ED Notes (Addendum)
ED TO INPATIENT HANDOFF REPORT  ED Nurse Name and Phone #: [place] Highland [phone]  S Name/Age/Gender Joseph Leach 87 y.o. male Room/Bed: WA24/WA24  Code Status   Code Status: DNR  Home/SNF/Other Home Patient oriented to: self Is this baseline? No   Triage Complete: Triage complete  Chief Complaint End of life care [Z51.5]  Triage Note Pt arrived via EMS, from [place], has had falls x3 unwitnessed in the last 24hrs. On blood thinners. Cognitive and physical decline x3 weeks. Recent dx of pancreatic CA, per EMS, pt not aware.  Pt in room with wife as assisted living, pt wife tested COVID (+)    Allergies No Known Allergies  Level of Care/Admitting Diagnosis ED Disposition    ED Disposition Condition [facility]: [facility] [[id number]]  Level of Care: Palliative Care [15]  May admit patient to [facility] or [facility] if equivalent level of care is available:: Yes  Covid Evaluation: Asymptomatic Screening Protocol (No Symptoms)  Diagnosis: End of life care [phone]  Admitting Physician: Doran Heater [[id number]]  Attending Physician: Doran Heater [[id number]]  Estimated length of stay: 3 - 4 days  Certification:: I certify this patient will need inpatient services for at least 2 midnights       B Medical/Surgery History Past Medical History:  Diagnosis Date  . Hypertension   . SCC (squamous cell carcinoma) 08/02/2019   RIGHT FOREARM  (TX WITH BX)  . SCC (squamous cell carcinoma) 08/02/2019   LEFT FOREARM  (TX WITH BX)  . Squamous cell carcinoma of skin 08/08/2017   in situ-left forearm (txpbx)  . Squamous cell carcinoma of skin 01/09/2019   in situ-left forearm (txpbx)  . Squamous cell carcinoma of skin 01/09/2019   in situ-right hand proximal (txpbx)  . Squamous cell carcinoma of skin 01/09/2019   in situ-right arm (txpbx)   Past Surgical History:  Procedure Laterality Date  . APPENDECTOMY    .  LIGAMENT REPAIR Right   . PROSTATECTOMY    . TOTAL HIP ARTHROPLASTY       A IV Location/Drains/Wounds Patient Lines/Drains/Airways Status    Active Line/Drains/Airways    Name Placement date Placement time Site Days   Peripheral IV 01/20/2020 Left Antecubital 01/22/2020  1540  Antecubital  1   External Urinary Catheter 01/31/2020  1035  --  1          Intake/Output Last 24 hours  Intake/Output Summary (Last 24 hours) at 02/03/2020 0718 Last data filed at 01/22/2020 2340 Gross per 24 hour  Intake 3 ml  Output --  Net 3 ml    Labs/Imaging Results for orders placed or performed during the hospital encounter of 02/01/2020 (from the past 48 hour(s))  CBC with Differential     Status: Abnormal   Collection Time: 01/25/2020 10:11 AM  Result Value Ref Range   WBC 10.2 4.0 - 10.5 K/uL   RBC 3.23 (L) 4.22 - 5.81 MIL/uL   Hemoglobin 9.2 (L) 13.0 - 17.0 g/dL   HCT 31.5 (L) 39.0 - 52.0 %   MCV 97.5 80.0 - 100.0 fL   MCH 28.5 26.0 - 34.0 pg   MCHC 29.2 (L) 30.0 - 36.0 g/dL   RDW 17.2 (H) 11.5 - 15.5 %   Platelets 203 150 - 400 K/uL   nRBC 0.0 0.0 - 0.2 %   Neutrophils Relative % 66 %   Neutro Abs 6.9 1.7 - 7.7 K/uL   Lymphocytes  Relative 13 %   Lymphs Abs 1.3 0.7 - 4.0 K/uL   Monocytes Relative 8 %   Monocytes Absolute 0.8 0.1 - 1.0 K/uL   Eosinophils Relative 2 %   Eosinophils Absolute 0.2 0.0 - 0.5 K/uL   Basophils Relative 1 %   Basophils Absolute 0.1 0.0 - 0.1 K/uL   WBC Morphology MILD LEFT SHIFT (1-5% METAS, OCC MYELO, OCC BANDS)    Immature Granulocytes 10 %   Abs Immature Granulocytes 0.97 (H) 0.00 - 0.07 K/uL   Reactive, Benign Lymphocytes PRESENT    Tear Drop Cells PRESENT    Ovalocytes PRESENT     Comment: Performed at [facility], [place] [address], [place], [place] [address]  Comprehensive metabolic panel     Status: Abnormal   Collection Time: 02/13/2020 10:11 AM  Result Value Ref Range   Sodium 144 135 - 145 mmol/L   Potassium 3.6 3.5 - 5.1 mmol/L    Chloride 112 (H) 98 - 111 mmol/L   CO2 22 22 - 32 mmol/L   Glucose, Bld 114 (H) 70 - 99 mg/dL    Comment: Glucose reference range applies only to samples taken after fasting for at least 8 hours.   BUN 55 (H) 8 - 23 mg/dL   Creatinine, Ser 2.33 (H) 0.61 - 1.24 mg/dL   Calcium 8.7 (L) 8.9 - 10.3 mg/dL   Total Protein 6.1 (L) 6.5 - 8.1 g/dL   Albumin 2.5 (L) 3.5 - 5.0 g/dL   AST 22 15 - 41 U/L   ALT 16 0 - 44 U/L   Alkaline Phosphatase 162 (H) 38 - 126 U/L   Total Bilirubin 1.5 (H) 0.3 - 1.2 mg/dL   GFR, Estimated 26 (L) >60 mL/min    Comment: (NOTE) Calculated using the CKD-EPI Creatinine Equation (2021)    Anion gap 10 5 - 15    Comment: Performed at [facility], [place] [address], [place], [place] [address]  Resp Panel by RT-PCR (Flu A&B, Covid)     Status: None   Collection Time: 02/06/2020 12:40 PM   Specimen: Nasopharyngeal(NP) swabs in vial transport medium  Result Value Ref Range   SARS Coronavirus 2 by RT PCR NEGATIVE NEGATIVE    Comment: (NOTE) SARS-CoV-2 target nucleic acids are NOT DETECTED.  The SARS-CoV-2 RNA is generally detectable in upper respiratory specimens during the acute phase of infection. The lowest concentration of SARS-CoV-2 viral copies this assay can detect is 138 copies/mL. A negative result does not preclude SARS-Cov-2 infection and should not be used as the sole basis for treatment or other patient management decisions. A negative result may occur with  improper specimen collection/handling, submission of specimen other than nasopharyngeal swab, presence of viral mutation(s) within the areas targeted by this assay, and inadequate number of viral copies(<138 copies/mL). A negative result must be combined with clinical observations, patient history, and epidemiological information. The expected result is Negative.  Fact Sheet for Patients:  EntrepreneurPulse.com.au  Fact Sheet for Healthcare Providers:   IncredibleEmployment.be  This test is no t yet approved or cleared by the [place] FDA and  has been authorized for detection and/or diagnosis of SARS-CoV-2 by FDA under an Emergency Use Authorization (EUA). This EUA will remain  in effect (meaning this test can be used) for the duration of the COVID-19 declaration under Section 564(b)(1) of the Act, 21 U.S.C.section 360bbb-3(b)(1), unless the authorization is terminated  or revoked sooner.       Influenza A  by PCR NEGATIVE NEGATIVE   Influenza B by PCR NEGATIVE NEGATIVE    Comment: (NOTE) The Xpert Xpress SARS-CoV-2/FLU/RSV plus assay is intended as an aid in the diagnosis of influenza from Nasopharyngeal swab specimens and should not be used as a sole basis for treatment. Nasal washings and aspirates are unacceptable for Xpert Xpress SARS-CoV-2/FLU/RSV testing.  Fact Sheet for Patients: EntrepreneurPulse.com.au  Fact Sheet for Healthcare Providers: IncredibleEmployment.be  This test is not yet approved or cleared by the [place] FDA and has been authorized for detection and/or diagnosis of SARS-CoV-2 by FDA under an Emergency Use Authorization (EUA). This EUA will remain in effect (meaning this test can be used) for the duration of the COVID-19 declaration under Section 564(b)(1) of the Act, 21 U.S.C. section 360bbb-3(b)(1), unless the authorization is terminated or revoked.  Performed at [facility], [place] [address] [place], [place] [address]   Urinalysis, Routine w reflex microscopic     Status: Abnormal   Collection Time: 01/28/2020 12:50 PM  Result Value Ref Range   Color, Urine YELLOW YELLOW   APPearance HAZY (A) CLEAR   Specific Gravity, Urine 1.014 1.005 - 1.030   pH 5.0 5.0 - 8.0   Glucose, UA NEGATIVE NEGATIVE mg/dL   Hgb urine dipstick NEGATIVE NEGATIVE   Bilirubin Urine NEGATIVE NEGATIVE   Ketones, ur 5 (A) NEGATIVE mg/dL    Protein, ur NEGATIVE NEGATIVE mg/dL   Nitrite NEGATIVE NEGATIVE   Leukocytes,Ua NEGATIVE NEGATIVE    Comment: Performed at [facility] [address] [place],  [address]   DG Chest 2 View  Result Date: 02/06/2020 CLINICAL DATA:  Patient had 3 unwitnessed falls in the last 24hrs. Cognitive and physical decline x 3 weeks. Recent dx of pancreatic CA. Pt's wife in room at assisted living tested COVID (+) EXAM: CHEST - 2 VIEW COMPARISON:  None. FINDINGS: Cardiomegaly. Low lung volumes. There are diffuse bilateral airspace opacities concerning for multifocal infection or edema. No pneumothorax or large pleural effusion. No acute finding in the visualized skeleton. IMPRESSION: Cardiomegaly. Diffuse bilateral airspace opacities which are nonspecific but concerning for multifocal infection or edema. Electronically Signed   By: Audie Pinto M.D.   On: 01/20/2020 11:33   CT Head Wo Contrast  Result Date: 02/07/2020 CLINICAL DATA:  Multiple falls. EXAM: CT HEAD WITHOUT CONTRAST CT CERVICAL SPINE WITHOUT CONTRAST TECHNIQUE: Multidetector CT imaging of the head and cervical spine was performed following the standard protocol without intravenous contrast. Multiplanar CT image reconstructions of the cervical spine were also generated. COMPARISON:  None. FINDINGS: CT HEAD FINDINGS Brain: Mild diffuse cortical atrophy is noted. No mass effect or midline shift is noted. Ventricular size is within normal limits. There is no evidence of mass lesion, hemorrhage or acute infarction. Vascular: No hyperdense vessel or unexpected calcification. Skull: Normal. Negative for fracture or focal lesion. Sinuses/Orbits: Right sphenoid sinusitis is noted. Other: None. CT CERVICAL SPINE FINDINGS Alignment: Normal. Skull base and vertebrae: No acute fracture. No primary bone lesion or focal pathologic process. Soft tissues and spinal canal: No prevertebral fluid or swelling. No visible canal hematoma. Disc  levels: Severe degenerative disc disease is noted at C5-6 and C6-7. Upper chest: Multiple nodules are seen in the visualized lung fields consistent with metastatic disease. Other: None. IMPRESSION: 1. Mild diffuse cortical atrophy. Right sphenoid sinusitis. No acute intracranial abnormality seen. 2. Severe multilevel degenerative disc disease. No acute abnormality seen in the cervical spine. 3. Multiple nodules are noted in the visualized  lung fields consistent with metastatic disease. Electronically Signed   By: Marijo Conception M.D.   On: 02/03/2020 11:13   CT Cervical Spine Wo Contrast  Result Date: 02/03/2020 CLINICAL DATA:  Multiple falls. EXAM: CT HEAD WITHOUT CONTRAST CT CERVICAL SPINE WITHOUT CONTRAST TECHNIQUE: Multidetector CT imaging of the head and cervical spine was performed following the standard protocol without intravenous contrast. Multiplanar CT image reconstructions of the cervical spine were also generated. COMPARISON:  None. FINDINGS: CT HEAD FINDINGS Brain: Mild diffuse cortical atrophy is noted. No mass effect or midline shift is noted. Ventricular size is within normal limits. There is no evidence of mass lesion, hemorrhage or acute infarction. Vascular: No hyperdense vessel or unexpected calcification. Skull: Normal. Negative for fracture or focal lesion. Sinuses/Orbits: Right sphenoid sinusitis is noted. Other: None. CT CERVICAL SPINE FINDINGS Alignment: Normal. Skull base and vertebrae: No acute fracture. No primary bone lesion or focal pathologic process. Soft tissues and spinal canal: No prevertebral fluid or swelling. No visible canal hematoma. Disc levels: Severe degenerative disc disease is noted at C5-6 and C6-7. Upper chest: Multiple nodules are seen in the visualized lung fields consistent with metastatic disease. Other: None. IMPRESSION: 1. Mild diffuse cortical atrophy. Right sphenoid sinusitis. No acute intracranial abnormality seen. 2. Severe multilevel degenerative disc  disease. No acute abnormality seen in the cervical spine. 3. Multiple nodules are noted in the visualized lung fields consistent with metastatic disease. Electronically Signed   By: Marijo Conception M.D.   On: 02/05/2020 11:13   DG Hips Bilat W or Wo Pelvis 3-4 Views  Result Date: 01/22/2020 CLINICAL DATA:  Fall x3 over the prior day, pancreatic cancer EXAM: DG HIP (WITH OR WITHOUT PELVIS) 3-4V BILAT COMPARISON:  01/29/2020 CT abdomen/pelvis FINDINGS: Right total hip arthroplasty with no evidence of hardware fracture or loosening. No pelvic fracture or diastasis. No osseous fracture or dislocation in either hip. No suspicious focal osseous lesions. Surgical clips overlie the deep pelvis bilaterally. Mild left hip osteoarthritis. IMPRESSION: No fracture or dislocation in either hip. Right total hip arthroplasty without hardware complication. Mild left hip osteoarthritis. Electronically Signed   By: Ilona Sorrel M.D.   On: 02/01/2020 11:35    Pending Labs Unresulted Labs (From admission, onward)         None      Vitals/Pain Today's Vitals   02/09/2020 2231 02/15/2020 2300 02/03/20 0327 02/03/20 0640  BP: 98/60 111/75 92/64 90/63   Pulse: 99 (!) 104 (!) 101 100  Resp: 19 (!) 30 (!) 26 20  Temp:      TempSrc:      SpO2: (!) 89% 92% 91% 93%  PainSc:        Isolation Precautions Airborne and Contact precautions  Medications Medications  acetaminophen (TYLENOL) tablet 650 mg (has no administration in time range)    Or  acetaminophen (TYLENOL) suppository 650 mg (has no administration in time range)  haloperidol lactate (HALDOL) injection 0.5 mg (has no administration in time range)  ondansetron (ZOFRAN-ODT) disintegrating tablet 4 mg (has no administration in time range)    Or  ondansetron (ZOFRAN) injection 4 mg (has no administration in time range)  glycopyrrolate (ROBINUL) tablet 1 mg (has no administration in time range)    Or  glycopyrrolate (ROBINUL) injection 0.2 mg (has no  administration in time range)    Or  glycopyrrolate (ROBINUL) injection 0.2 mg (has no administration in time range)  antiseptic oral rinse (BIOTENE) solution 15 mL (has no administration in time  range)  polyvinyl alcohol (LIQUIFILM TEARS) 1.4 % ophthalmic solution 1 drop (has no administration in time range)  sodium chloride flush (NS) 0.9 % injection 3 mL (3 mLs Intravenous Given 01/31/2020 2340)  sodium chloride flush (NS) 0.9 % injection 3 mL (has no administration in time range)  0.9 %  sodium chloride infusion (has no administration in time range)  HYDROmorphone (DILAUDID) injection 0.5 mg (has no administration in time range)  HYDROmorphone (DILAUDID) injection 0.25 mg (0.25 mg Intravenous Given 02/03/20 0532)  haloperidol lactate (HALDOL) injection 1 mg (1 mg Intravenous Given 02/03/20 0500)  dicyclomine (BENTYL) tablet 20 mg (20 mg Oral Not Given 02/03/20 0701)  docusate sodium (COLACE) capsule 100 mg (100 mg Oral Not Given 02/12/2020 1706)  senna-docusate (Senokot-S) tablet 1 tablet (has no administration in time range)  polyethylene glycol (MIRALAX / GLYCOLAX) packet 17 g (has no administration in time range)    Mobility non-ambulatory High fall risk   Focused Assessments Pulmonary Assessment Handoff:  Lung sounds:   O2 Device: Room Air        R Recommendations: See Admitting Provider Note  Report given to: nurse on 6th EAST  Additional Notes:

## 2020-02-03 NOTE — Progress Notes (Signed)
Manufacturing engineer [facility]) Hospital Liaison note.   Received request yesterday from [place] for family interest in [facility]. Chart and pt information have been reviewed by [facility] physician.  [facility] eligibility confirmed.  [facility] is unable to offer a room today. Hospital Liaison will follow up tomorrow or sooner if a room becomes available. Please do not hesitate to call with questions.    Thank you for the opportunity to participate in this patient's care.  Chrislyn Edison Pace, BSN, RN [place] (listed on [place] under [facility])    [phone]

## 2020-02-03 NOTE — Progress Notes (Signed)
PROGRESS NOTE    Joseph Leach  JXB:[id number] DOB: 1925-04-01 DOA: 02/01/2020 PCP: Maurice Small, MD     Brief Narrative:  Joseph Leach is a 87 y.o. male with hx of anemia, A. fib, essential hypertension, metastatic disease (unknown origin), failure to thrive, CKD 4, who presented to the hospital on 02/09/2020 secondary to worsening encephalopathy and rapidly progressing functional status.  He was just recently discharged on 01/31/2020 when he was admitted for AKI and progressing weakness.  During hospitalization it was noted he had findings highly consistent with metastatic disease.  Imaging showed likely mets to the liver, lungs, and skeletal lesions.  He was evaluated by oncology however family elected to avoid invasive measures, pursue hospice, and thus a biopsy for determining etiology was deferred.  Plan was to transition to hospice after discharge.  However patient's condition rapidly progressive at nursing facility, continue to get weaker, more encephalopathic, poor p.o. intake, and agitated.  They have been in contact with hospice but unfortunately no beds were available.  Thus family brought him to the hospital to help provide the adequate needed end-of-life support with plans to discharge to hospice once bed available if patient does not pass prior.  New events last 24 hours / Subjective: Patient remains restless, alert but does not respond  Assessment & Plan:   Active Problems:   End of life care   Failure to thrive Metastatic cancer Recurrent falls Poor p.o. intake Protein malnutrition CKD stage IV Acute metabolic encephalopathy A. Fib History of pericardial effusion Debility  Continue comfort care, appreciate palliative care medicine.  Hospice following for residential hospice placement.   Code Status: DNR Family Communication: None at bedside Disposition Plan:  Status is: Inpatient  Remains inpatient appropriate because:Unsafe d/c plan   Dispo: The  patient is from: SNF              Anticipated d/c is to: Residential hospice              Anticipated d/c date is: 1 day              Patient currently terminally ill, awaiting residential hospice bed.   Antimicrobials:  Anti-infectives (From admission, onward)   None        Objective: Vitals:   01/27/2020 2300 02/03/20 0327 02/03/20 0640 02/03/20 0805  BP: 111/75 92/64 90/63  129/78  Pulse: (!) 104 (!) 101 100 (!) 118  Resp: (!) 30 (!) 26 20 20   Temp:    99.7 F (37.6 C)  TempSrc:    Oral  SpO2: 92% 91% 93% 94%    Intake/Output Summary (Last 24 hours) at 02/03/2020 1255 Last data filed at 01/23/2020 2340 Gross per 24 hour  Intake 3 ml  Output --  Net 3 ml   There were no vitals filed for this visit.  Examination:  General exam: Appears calm but restless  Respiratory system: Clear to auscultation. Respiratory effort normal. Cardiovascular system: S1 & S2 heard  Gastrointestinal system: Abdomen is nondistended, soft  Central nervous system: Alert  Extremities: Symmetric in appearance     Data Reviewed: I have personally reviewed following labs and imaging studies  CBC: Recent Labs  Lab 01/29/20 1951 01/30/20 0554 01/31/20 0613 02/17/2020 1011  WBC 8.9 9.3 8.9 10.2  NEUTROABS 6.0  --   --  6.9  HGB 9.0* 8.5* 8.7* 9.2*  HCT 29.0* 28.4* 29.7* 31.5*  MCV 96.0 96.3 97.4 97.5  PLT 176 199 176 130   Basic Metabolic  Panel: Recent Labs  Lab 01/29/20 1951 01/30/20 0554 01/31/20 0613 01/24/2020 1011  NA 139 140 143 144  K 4.3 3.4* 4.3 3.6  CL 106 107 111 112*  CO2 22 22 20* 22  GLUCOSE 122* 98 85 114*  BUN 66* 58* 54* 55*  CREATININE 2.99* 2.63* 2.38* 2.33*  CALCIUM 8.8* 8.5* 8.7* 8.7*   GFR: Estimated Creatinine Clearance: 20.2 mL/min (A) (by C-G formula based on SCr of 2.33 mg/dL (H)). Liver Function Tests: Recent Labs  Lab 01/29/20 1951 01/26/2020 1011  AST 27 22  ALT 17 16  ALKPHOS 173* 162*  BILITOT 0.6 1.5*  PROT 5.9* 6.1*  ALBUMIN 2.6* 2.5*    No results for input(s): LIPASE, AMYLASE in the last 168 hours. Recent Labs  Lab 01/30/20 0554  AMMONIA 16   Coagulation Profile: No results for input(s): INR, PROTIME in the last 168 hours. Cardiac Enzymes: No results for input(s): CKTOTAL, CKMB, CKMBINDEX, TROPONINI in the last 168 hours. BNP (last 3 results) No results for input(s): PROBNP in the last 8760 hours. HbA1C: No results for input(s): HGBA1C in the last 72 hours. CBG: No results for input(s): GLUCAP in the last 168 hours. Lipid Profile: No results for input(s): CHOL, HDL, LDLCALC, TRIG, CHOLHDL, LDLDIRECT in the last 72 hours. Thyroid Function Tests: No results for input(s): TSH, T4TOTAL, FREET4, T3FREE, THYROIDAB in the last 72 hours. Anemia Panel: No results for input(s): VITAMINB12, FOLATE, FERRITIN, TIBC, IRON, RETICCTPCT in the last 72 hours. Sepsis Labs: No results for input(s): PROCALCITON, LATICACIDVEN in the last 168 hours.  Recent Results (from the past 240 hour(s))  Urine culture     Status: None   Collection Time: 01/29/20  9:20 PM   Specimen: Urine, Random  Result Value Ref Range Status   Specimen Description   Final    URINE, RANDOM Performed at [facility] [address] [place], [place] [address]    Special Requests   Final    NONE Performed at [facility], [place] [address], [place], [place] [address]    Culture   Final    NO GROWTH Performed at [facility], [place] [address] [place],  [address]    Report Status 01/31/2020 FINAL  Final  SARS CORONAVIRUS 2 (TAT 6-24 HRS) Nasopharyngeal Nasopharyngeal Swab     Status: None   Collection Time: 01/29/20 11:50 PM   Specimen: Nasopharyngeal Swab  Result Value Ref Range Status   SARS Coronavirus 2 NEGATIVE NEGATIVE Final    Comment: (NOTE) SARS-CoV-2 target nucleic acids are NOT DETECTED.  The SARS-CoV-2 RNA is generally detectable in upper and lower respiratory specimens during the acute  phase of infection. Negative results do not preclude SARS-CoV-2 infection, do not rule out co-infections with other pathogens, and should not be used as the sole basis for treatment or other patient management decisions. Negative results must be combined with clinical observations, patient history, and epidemiological information. The expected result is Negative.  Fact Sheet for Patients: SugarRoll.be  Fact Sheet for Healthcare Providers: https://www.woods-mathews.com/  This test is not yet approved or cleared by the [place] FDA and  has been authorized for detection and/or diagnosis of SARS-CoV-2 by FDA under an Emergency Use Authorization (EUA). This EUA will remain  in effect (meaning this test can be used) for the duration of the COVID-19 declaration under Se ction 564(b)(1) of the Act, 21 U.S.C. section 360bbb-3(b)(1), unless the authorization is terminated or revoked sooner.  Performed at [facility], [place] [address] [place], [place] [address]   Resp Panel by RT-PCR (Flu A&B, Covid)     Status: None   Collection Time: 02/11/2020 12:40 PM   Specimen: Nasopharyngeal(NP) swabs in vial transport medium  Result Value Ref Range Status   SARS Coronavirus 2 by RT PCR NEGATIVE NEGATIVE Final    Comment: (NOTE) SARS-CoV-2 target nucleic acids are NOT DETECTED.  The SARS-CoV-2 RNA is generally detectable in upper respiratory specimens during the acute phase of infection. The lowest concentration of SARS-CoV-2 viral copies this assay can detect is 138 copies/mL. A negative result does not preclude SARS-Cov-2 infection and should not be used as the sole basis for treatment or other patient management decisions. A negative result may occur with  improper specimen collection/handling, submission of specimen other than nasopharyngeal swab, presence of viral mutation(s) within the areas targeted by this assay, and inadequate number of  viral copies(<138 copies/mL). A negative result must be combined with clinical observations, patient history, and epidemiological information. The expected result is Negative.  Fact Sheet for Patients:  EntrepreneurPulse.com.au  Fact Sheet for Healthcare Providers:  IncredibleEmployment.be  This test is no t yet approved or cleared by the [place] FDA and  has been authorized for detection and/or diagnosis of SARS-CoV-2 by FDA under an Emergency Use Authorization (EUA). This EUA will remain  in effect (meaning this test can be used) for the duration of the COVID-19 declaration under Section 564(b)(1) of the Act, 21 U.S.C.section 360bbb-3(b)(1), unless the authorization is terminated  or revoked sooner.       Influenza A by PCR NEGATIVE NEGATIVE Final   Influenza B by PCR NEGATIVE NEGATIVE Final    Comment: (NOTE) The Xpert Xpress SARS-CoV-2/FLU/RSV plus assay is intended as an aid in the diagnosis of influenza from Nasopharyngeal swab specimens and should not be used as a sole basis for treatment. Nasal washings and aspirates are unacceptable for Xpert Xpress SARS-CoV-2/FLU/RSV testing.  Fact Sheet for Patients: EntrepreneurPulse.com.au  Fact Sheet for Healthcare Providers: IncredibleEmployment.be  This test is not yet approved or cleared by the [place] FDA and has been authorized for detection and/or diagnosis of SARS-CoV-2 by FDA under an Emergency Use Authorization (EUA). This EUA will remain in effect (meaning this test can be used) for the duration of the COVID-19 declaration under Section 564(b)(1) of the Act, 21 U.S.C. section 360bbb-3(b)(1), unless the authorization is terminated or revoked.  Performed at [facility], [place] [address] [place], [place] [address]       Radiology Studies: DG Chest 2 View  Result Date: 01/20/2020 CLINICAL DATA:  Patient had 3  unwitnessed falls in the last 24hrs. Cognitive and physical decline x 3 weeks. Recent dx of pancreatic CA. Pt's wife in room at assisted living tested COVID (+) EXAM: CHEST - 2 VIEW COMPARISON:  None. FINDINGS: Cardiomegaly. Low lung volumes. There are diffuse bilateral airspace opacities concerning for multifocal infection or edema. No pneumothorax or large pleural effusion. No acute finding in the visualized skeleton. IMPRESSION: Cardiomegaly. Diffuse bilateral airspace opacities which are nonspecific but concerning for multifocal infection or edema. Electronically Signed   By: Audie Pinto M.D.   On: 01/29/2020 11:33   CT Head Wo Contrast  Result Date: 02/05/2020 CLINICAL DATA:  Multiple falls. EXAM: CT HEAD WITHOUT CONTRAST CT CERVICAL SPINE WITHOUT CONTRAST TECHNIQUE: Multidetector CT imaging of the head and cervical spine was performed following the standard protocol without intravenous contrast. Multiplanar CT image reconstructions of the  cervical spine were also generated. COMPARISON:  None. FINDINGS: CT HEAD FINDINGS Brain: Mild diffuse cortical atrophy is noted. No mass effect or midline shift is noted. Ventricular size is within normal limits. There is no evidence of mass lesion, hemorrhage or acute infarction. Vascular: No hyperdense vessel or unexpected calcification. Skull: Normal. Negative for fracture or focal lesion. Sinuses/Orbits: Right sphenoid sinusitis is noted. Other: None. CT CERVICAL SPINE FINDINGS Alignment: Normal. Skull base and vertebrae: No acute fracture. No primary bone lesion or focal pathologic process. Soft tissues and spinal canal: No prevertebral fluid or swelling. No visible canal hematoma. Disc levels: Severe degenerative disc disease is noted at C5-6 and C6-7. Upper chest: Multiple nodules are seen in the visualized lung fields consistent with metastatic disease. Other: None. IMPRESSION: 1. Mild diffuse cortical atrophy. Right sphenoid sinusitis. No acute intracranial  abnormality seen. 2. Severe multilevel degenerative disc disease. No acute abnormality seen in the cervical spine. 3. Multiple nodules are noted in the visualized lung fields consistent with metastatic disease. Electronically Signed   By: Marijo Conception M.D.   On: 01/30/2020 11:13   CT Cervical Spine Wo Contrast  Result Date: 01/25/2020 CLINICAL DATA:  Multiple falls. EXAM: CT HEAD WITHOUT CONTRAST CT CERVICAL SPINE WITHOUT CONTRAST TECHNIQUE: Multidetector CT imaging of the head and cervical spine was performed following the standard protocol without intravenous contrast. Multiplanar CT image reconstructions of the cervical spine were also generated. COMPARISON:  None. FINDINGS: CT HEAD FINDINGS Brain: Mild diffuse cortical atrophy is noted. No mass effect or midline shift is noted. Ventricular size is within normal limits. There is no evidence of mass lesion, hemorrhage or acute infarction. Vascular: No hyperdense vessel or unexpected calcification. Skull: Normal. Negative for fracture or focal lesion. Sinuses/Orbits: Right sphenoid sinusitis is noted. Other: None. CT CERVICAL SPINE FINDINGS Alignment: Normal. Skull base and vertebrae: No acute fracture. No primary bone lesion or focal pathologic process. Soft tissues and spinal canal: No prevertebral fluid or swelling. No visible canal hematoma. Disc levels: Severe degenerative disc disease is noted at C5-6 and C6-7. Upper chest: Multiple nodules are seen in the visualized lung fields consistent with metastatic disease. Other: None. IMPRESSION: 1. Mild diffuse cortical atrophy. Right sphenoid sinusitis. No acute intracranial abnormality seen. 2. Severe multilevel degenerative disc disease. No acute abnormality seen in the cervical spine. 3. Multiple nodules are noted in the visualized lung fields consistent with metastatic disease. Electronically Signed   By: Marijo Conception M.D.   On: 01/23/2020 11:13   DG Hips Bilat W or Wo Pelvis 3-4 Views  Result  Date: 02/06/2020 CLINICAL DATA:  Fall x3 over the prior day, pancreatic cancer EXAM: DG HIP (WITH OR WITHOUT PELVIS) 3-4V BILAT COMPARISON:  01/29/2020 CT abdomen/pelvis FINDINGS: Right total hip arthroplasty with no evidence of hardware fracture or loosening. No pelvic fracture or diastasis. No osseous fracture or dislocation in either hip. No suspicious focal osseous lesions. Surgical clips overlie the deep pelvis bilaterally. Mild left hip osteoarthritis. IMPRESSION: No fracture or dislocation in either hip. Right total hip arthroplasty without hardware complication. Mild left hip osteoarthritis. Electronically Signed   By: Ilona Sorrel M.D.   On: 02/17/2020 11:35      Scheduled Meds: . dicyclomine  20 mg Oral TID AC & HS  . docusate sodium  100 mg Oral Daily  . haloperidol lactate  1 mg Intravenous Q6H  .  HYDROmorphone (DILAUDID) injection  1 mg Intravenous NOW  . sodium chloride flush  3 mL Intravenous Q12H  Continuous Infusions: . sodium chloride    . HYDROmorphone       LOS: 1 day      Time spent: 20 minutes   Dessa Phi, DO [facility] 02/03/2020, 12:55 PM   Available via Epic secure chat 7am-7pm After these hours, please refer to coverage provider listed on [email or web address]

## 2020-02-03 NOTE — TOC Initial Note (Signed)
Transition of Care [facility]) - Initial/Assessment Note    Patient Details  Name: Joseph Leach MRN: [id number] Date of Birth: Jan 28, 1925  Transition of Care (TOC) CM/SW Contact:    Joaquin Courts, RN Phone Number: 02/03/2020, 9:48 AM  Clinical Narrative:                 CM followed up with [facility] hospice, at this time no residential hospice bed available at [facility].  TOC will continue to follow.  Expected Discharge Plan: [place] Barriers to Discharge: Hospice Bed not available   Patient Goals and CMS Choice Patient states their goals for this hospitalization and ongoing recovery are:: to go to residential hospice      Expected Discharge Plan and Services Expected Discharge Plan: [place]   Discharge Planning Services: CM Consult Post Acute Care Choice: Hospice Living arrangements for the past 2 months: [place] Expected Discharge Date:  (unknown)                                    Prior Living Arrangements/Services Living arrangements for the past 2 months: [place]   Patient language and need for interpreter reviewed:: Yes Do you feel safe going back to the place where you live?: Yes      Need for Family Participation in Patient Care: Yes (Comment) Care giver support system in place?: Yes (comment)   Criminal Activity/Legal Involvement Pertinent to Current Situation/Hospitalization: No - Comment as needed  Activities of Daily Living Home Assistive Devices/Equipment: Blood pressure cuff,Hospital bed,Wheelchair,Oxygen,Nebulizer,Grab bars around toilet,Grab bars in shower,Hand-held shower hose ([facility] for [facility]) ADL Screening (condition at time of admission) Patient's cognitive ability adequate to safely complete daily activities?: No Is the patient deaf or have difficulty hearing?: No Does the patient have difficulty seeing, even when  wearing glasses/contacts?: No Does the patient have difficulty concentrating, remembering, or making decisions?: Yes Patient able to express need for assistance with ADLs?: No Does the patient have difficulty dressing or bathing?: Yes Independently performs ADLs?: No Communication: Dependent (not talking at this time) Is this a change from baseline?: Change from baseline, expected to last >3 days Dressing (OT): Dependent Is this a change from baseline?: Change from baseline, expected to last >3 days Grooming: Dependent Is this a change from baseline?: Change from baseline, expected to last >3 days Feeding: Dependent Is this a change from baseline?: Change from baseline, expected to last >3 days Bathing: Dependent Is this a change from baseline?: Change from baseline, expected to last >3 days Toileting: Dependent Is this a change from baseline?: Change from baseline, expected to last >3days In/Out Bed: Dependent Is this a change from baseline?: Change from baseline, expected to last >3 days Walks in Home: Dependent Is this a change from baseline?: Change from baseline, expected to last >3 days Does the patient have difficulty walking or climbing stairs?: Yes (secondary to weakness) Weakness of Legs: Both Weakness of Arms/Hands: Both  Permission Sought/Granted                  Emotional Assessment           Psych Involvement: No (comment)  Admission diagnosis:  End of life care [Z51.5] Patient Active Problem List   Diagnosis Date Noted  . End of life care 02/10/2020  . Acute renal failure superimposed on stage 3b chronic kidney  disease ([place]) 01/29/2020  . Metastatic disease ([place]) 01/29/2020  . Normocytic anemia 01/29/2020  . Pericardial effusion 01/29/2020  . Right renal mass 01/29/2020  . Unspecified atrial fibrillation ([place]) 01/29/2020  . Essential hypertension 01/29/2020  . Liver masses   . Iron deficiency anemia 11/08/2019   PCP:  Maurice Small, MD Pharmacy:    [facility] [place], [place] - [place] AT [address] [place] [address] Phone: [phone] Fax: [phone]     Social Determinants of Health (SDOH) Interventions    Readmission Risk Interventions No flowsheet data found.

## 2020-02-04 ENCOUNTER — Ambulatory Visit: Payer: Medicare Other | Admitting: Physician Assistant

## 2020-02-04 ENCOUNTER — Ambulatory Visit: Payer: Medicare Other | Admitting: Cardiology

## 2020-02-04 DIAGNOSIS — R5381 Other malaise: Secondary | ICD-10-CM

## 2020-02-04 DIAGNOSIS — Z66 Do not resuscitate: Secondary | ICD-10-CM

## 2020-02-04 DIAGNOSIS — R638 Other symptoms and signs concerning food and fluid intake: Secondary | ICD-10-CM

## 2020-02-04 DIAGNOSIS — R296 Repeated falls: Secondary | ICD-10-CM

## 2020-02-04 DIAGNOSIS — R627 Adult failure to thrive: Secondary | ICD-10-CM

## 2020-02-04 DIAGNOSIS — G9341 Metabolic encephalopathy: Secondary | ICD-10-CM

## 2020-02-04 DIAGNOSIS — E46 Unspecified protein-calorie malnutrition: Secondary | ICD-10-CM

## 2020-02-05 ENCOUNTER — Other Ambulatory Visit: Payer: Medicare Other

## 2020-02-05 ENCOUNTER — Ambulatory Visit: Payer: Medicare Other | Admitting: Hematology

## 2020-02-12 DIAGNOSIS — N189 Chronic kidney disease, unspecified: Secondary | ICD-10-CM | POA: Diagnosis not present

## 2020-02-12 DIAGNOSIS — I4891 Unspecified atrial fibrillation: Secondary | ICD-10-CM | POA: Diagnosis not present

## 2020-02-12 DIAGNOSIS — I10 Essential (primary) hypertension: Secondary | ICD-10-CM | POA: Diagnosis not present

## 2020-02-18 NOTE — Death Summary Note (Signed)
DEATH SUMMARY   Patient Details  Name: Joseph Leach MRN: [id number] DOB: [birth year]  Admission/Discharge Information   Admit Date:  02-10-20  Date of Death: Date of Death: 2020/02/12  Time of Death: Time of Death: 0950  Length of Stay: 2  Referring Physician: Maurice Small, MD   Reason(s) for Hospitalization  Metastatic cancer  Diagnoses  Preliminary cause of death:  Secondary Diagnoses (including complications and co-morbidities):  Principal Problem:   Metastatic disease ([place]) Active Problems:   CKD (chronic kidney disease) stage 4, GFR 15-29 ml/min (HCC)   Right renal mass   Unspecified atrial fibrillation ([place])   Liver masses   End of life care   DNR (do not resuscitate)   Failure to thrive in adult   Recurrent falls   Protein malnutrition ([place])   Poor fluid intake   Acute metabolic encephalopathy   [facility] Course (including significant findings, care, treatment, and services provided and events leading to death)  Glenford Peers a 87 y.o.malewith hx of anemia, A. fib, essential hypertension, metastatic disease (unknown origin), failure to thrive, CKD 4, who presented to the hospital on 02/10/20 secondary to worsening encephalopathy and rapidly progressing functional status.  He was just recently discharged on 01/31/2020 when he was admitted for AKI and progressing weakness. During hospitalization it was noted he had findings highly consistent with metastatic disease.Imaging showed likely mets to the liver, lungs, and skeletal lesions. He was evaluated by oncology however family elected to avoid invasive measures, pursue hospice, and thus a biopsy for determining etiology was deferred. Plan was to transition to hospice after discharge. However patient's condition rapidly progressive at nursing facility, continue to get weaker, more encephalopathic, poor p.o. intake, and agitated.They have been in contact with hospice but unfortunately  no beds were available. Thus family brought him to the hospital to help provide the adequate needed end-of-life support.   Pertinent Labs and Studies  Significant Diagnostic Studies DG Chest 2 View  Result Date: 2020/02/10 CLINICAL DATA:  Patient had 3 unwitnessed falls in the last 24hrs. Cognitive and physical decline x 3 weeks. Recent dx of pancreatic CA. Pt's wife in room at assisted living tested COVID (+) EXAM: CHEST - 2 VIEW COMPARISON:  None. FINDINGS: Cardiomegaly. Low lung volumes. There are diffuse bilateral airspace opacities concerning for multifocal infection or edema. No pneumothorax or large pleural effusion. No acute finding in the visualized skeleton. IMPRESSION: Cardiomegaly. Diffuse bilateral airspace opacities which are nonspecific but concerning for multifocal infection or edema. Electronically Signed   By: Audie Pinto M.D.   On: 10-Feb-2020 11:33   CT Head Wo Contrast  Result Date: 2020/02/10 CLINICAL DATA:  Multiple falls. EXAM: CT HEAD WITHOUT CONTRAST CT CERVICAL SPINE WITHOUT CONTRAST TECHNIQUE: Multidetector CT imaging of the head and cervical spine was performed following the standard protocol without intravenous contrast. Multiplanar CT image reconstructions of the cervical spine were also generated. COMPARISON:  None. FINDINGS: CT HEAD FINDINGS Brain: Mild diffuse cortical atrophy is noted. No mass effect or midline shift is noted. Ventricular size is within normal limits. There is no evidence of mass lesion, hemorrhage or acute infarction. Vascular: No hyperdense vessel or unexpected calcification. Skull: Normal. Negative for fracture or focal lesion. Sinuses/Orbits: Right sphenoid sinusitis is noted. Other: None. CT CERVICAL SPINE FINDINGS Alignment: Normal. Skull base and vertebrae: No acute fracture. No primary bone lesion or focal pathologic process. Soft tissues and spinal canal: No prevertebral fluid or swelling. No visible canal hematoma. Disc levels:  Severe  degenerative disc disease is noted at C5-6 and C6-7. Upper chest: Multiple nodules are seen in the visualized lung fields consistent with metastatic disease. Other: None. IMPRESSION: 1. Mild diffuse cortical atrophy. Right sphenoid sinusitis. No acute intracranial abnormality seen. 2. Severe multilevel degenerative disc disease. No acute abnormality seen in the cervical spine. 3. Multiple nodules are noted in the visualized lung fields consistent with metastatic disease. Electronically Signed   By: Marijo Conception M.D.   On: 02/11/2020 11:13   CT Cervical Spine Wo Contrast  Result Date: 02/16/2020 CLINICAL DATA:  Multiple falls. EXAM: CT HEAD WITHOUT CONTRAST CT CERVICAL SPINE WITHOUT CONTRAST TECHNIQUE: Multidetector CT imaging of the head and cervical spine was performed following the standard protocol without intravenous contrast. Multiplanar CT image reconstructions of the cervical spine were also generated. COMPARISON:  None. FINDINGS: CT HEAD FINDINGS Brain: Mild diffuse cortical atrophy is noted. No mass effect or midline shift is noted. Ventricular size is within normal limits. There is no evidence of mass lesion, hemorrhage or acute infarction. Vascular: No hyperdense vessel or unexpected calcification. Skull: Normal. Negative for fracture or focal lesion. Sinuses/Orbits: Right sphenoid sinusitis is noted. Other: None. CT CERVICAL SPINE FINDINGS Alignment: Normal. Skull base and vertebrae: No acute fracture. No primary bone lesion or focal pathologic process. Soft tissues and spinal canal: No prevertebral fluid or swelling. No visible canal hematoma. Disc levels: Severe degenerative disc disease is noted at C5-6 and C6-7. Upper chest: Multiple nodules are seen in the visualized lung fields consistent with metastatic disease. Other: None. IMPRESSION: 1. Mild diffuse cortical atrophy. Right sphenoid sinusitis. No acute intracranial abnormality seen. 2. Severe multilevel degenerative disc disease. No acute  abnormality seen in the cervical spine. 3. Multiple nodules are noted in the visualized lung fields consistent with metastatic disease. Electronically Signed   By: Marijo Conception M.D.   On: 01/27/2020 11:13   CT Renal Stone Study  Result Date: 01/29/2020 CLINICAL DATA:  Renal failure.  Suspected UTI. EXAM: CT ABDOMEN AND PELVIS WITHOUT CONTRAST TECHNIQUE: Multidetector CT imaging of the abdomen and pelvis was performed following the standard protocol without IV contrast. COMPARISON:  08/05/2019 FINDINGS: LOWER CHEST: There are multiple bibasilar nodular opacities that are new. In the right lower lobe there is a 1.8 cm nodule (9:88). In the lingula there is a 2.3 cm nodule (4:26) and a 1.6 cm nodule (4:21). In the left lower lobe there are 2 small nodules that measure 5-6 mm (4:25 and 4:31). There are small pleural effusions with basilar atelectasis. Large pericardial effusion is unchanged. HEPATOBILIARY: There are numerous hypodense masses throughout the liver, new. The largest masses in the right hepatic lobe measures 6.6 cm. The gallbladder is normal. PANCREAS: Normal pancreas. No ductal dilatation or peripancreatic fluid collection. SPLEEN: Normal. ADRENALS/URINARY TRACT: The adrenal glands are normal. Mass of right renal cyst measures 22 x 22 cm, unchanged. Areas of hyperdensity within the cyst on the prior study are no longer visible. The left kidney is atrophic with an unchanged upper pole cyst measuring 5.4 cm. The urinary bladder is normal for degree of distention STOMACH/BOWEL: There is no hiatal hernia. Normal duodenal course and caliber. No small bowel dilatation or inflammation. No focal colonic abnormality. Status post appendectomy. VASCULAR/LYMPHATIC: There is calcific aortic atherosclerosis. There is a cystic focus noted in the right anterior pelvis, unchanged (2:87). There is no retroperitoneal or pelvic sidewall lymphadenopathy. REPRODUCTIVE: Obscured by streak artifact from right hip hardware  and possible brachy therapy seeds. MUSCULOSKELETAL.  Right hip arthroplasty. Multilevel degenerative change. No lytic or blastic lesions. OTHER: There is a new subcutaneous soft tissue nodule at the right lower quadrant (2:71). This measures 2.3 cm. IMPRESSION: 1. Numerous new hypodense masses throughout the liver and multiple new bibasilar nodular opacities, consistent with metastatic disease. 2. New subcutaneous soft tissue nodule at the right lower quadrant, concerning for metastatic disease. 3. Large pericardial effusion and small pleural effusions. Aortic Atherosclerosis (ICD10-I70.0). Electronically Signed   By: Ulyses Jarred M.D.   On: 01/29/2020 21:55   DG Hips Bilat W or Wo Pelvis 3-4 Views  Result Date: 01/22/2020 CLINICAL DATA:  Fall x3 over the prior day, pancreatic cancer EXAM: DG HIP (WITH OR WITHOUT PELVIS) 3-4V BILAT COMPARISON:  01/29/2020 CT abdomen/pelvis FINDINGS: Right total hip arthroplasty with no evidence of hardware fracture or loosening. No pelvic fracture or diastasis. No osseous fracture or dislocation in either hip. No suspicious focal osseous lesions. Surgical clips overlie the deep pelvis bilaterally. Mild left hip osteoarthritis. IMPRESSION: No fracture or dislocation in either hip. Right total hip arthroplasty without hardware complication. Mild left hip osteoarthritis. Electronically Signed   By: Ilona Sorrel M.D.   On: 02/08/2020 11:35    Microbiology Recent Results (from the past 240 hour(s))  Urine culture     Status: None   Collection Time: 01/29/20  9:20 PM   Specimen: Urine, Random  Result Value Ref Range Status   Specimen Description   Final    URINE, RANDOM Performed at [facility] [address], [place], [place] [address]    Special Requests   Final    NONE Performed at [facility], [place] [address], [place], [place] [address]    Culture   Final    NO GROWTH Performed at [facility], [place] [address],  [place], [place] [address]    Report Status 01/31/2020 FINAL  Final  SARS CORONAVIRUS 2 (TAT 6-24 HRS) Nasopharyngeal Nasopharyngeal Swab     Status: None   Collection Time: 01/29/20 11:50 PM   Specimen: Nasopharyngeal Swab  Result Value Ref Range Status   SARS Coronavirus 2 NEGATIVE NEGATIVE Final    Comment: (NOTE) SARS-CoV-2 target nucleic acids are NOT DETECTED.  The SARS-CoV-2 RNA is generally detectable in upper and lower respiratory specimens during the acute phase of infection. Negative results do not preclude SARS-CoV-2 infection, do not rule out co-infections with other pathogens, and should not be used as the sole basis for treatment or other patient management decisions. Negative results must be combined with clinical observations, patient history, and epidemiological information. The expected result is Negative.  Fact Sheet for Patients: SugarRoll.be  Fact Sheet for Healthcare Providers: https://www.woods-mathews.com/  This test is not yet approved or cleared by the [place] FDA and  has been authorized for detection and/or diagnosis of SARS-CoV-2 by FDA under an Emergency Use Authorization (EUA). This EUA will remain  in effect (meaning this test can be used) for the duration of the COVID-19 declaration under Se ction 564(b)(1) of the Act, 21 U.S.C. section 360bbb-3(b)(1), unless the authorization is terminated or revoked sooner.  Performed at [facility], [place] [address], [place], [place] [address]   Resp Panel by RT-PCR (Flu A&B, Covid)     Status: None   Collection Time: 02/15/2020 12:40 PM   Specimen: Nasopharyngeal(NP) swabs in vial transport medium  Result Value Ref Range Status   SARS Coronavirus 2 by RT PCR NEGATIVE NEGATIVE Final  Comment: (NOTE) SARS-CoV-2 target nucleic acids are NOT DETECTED.  The SARS-CoV-2 RNA is generally detectable in upper respiratory specimens during the acute phase of  infection. The lowest concentration of SARS-CoV-2 viral copies this assay can detect is 138 copies/mL. A negative result does not preclude SARS-Cov-2 infection and should not be used as the sole basis for treatment or other patient management decisions. A negative result may occur with  improper specimen collection/handling, submission of specimen other than nasopharyngeal swab, presence of viral mutation(s) within the areas targeted by this assay, and inadequate number of viral copies(<138 copies/mL). A negative result must be combined with clinical observations, patient history, and epidemiological information. The expected result is Negative.  Fact Sheet for Patients:  EntrepreneurPulse.com.au  Fact Sheet for Healthcare Providers:  IncredibleEmployment.be  This test is no t yet approved or cleared by the [place] FDA and  has been authorized for detection and/or diagnosis of SARS-CoV-2 by FDA under an Emergency Use Authorization (EUA). This EUA will remain  in effect (meaning this test can be used) for the duration of the COVID-19 declaration under Section 564(b)(1) of the Act, 21 U.S.C.section 360bbb-3(b)(1), unless the authorization is terminated  or revoked sooner.       Influenza A by PCR NEGATIVE NEGATIVE Final   Influenza B by PCR NEGATIVE NEGATIVE Final    Comment: (NOTE) The Xpert Xpress SARS-CoV-2/FLU/RSV plus assay is intended as an aid in the diagnosis of influenza from Nasopharyngeal swab specimens and should not be used as a sole basis for treatment. Nasal washings and aspirates are unacceptable for Xpert Xpress SARS-CoV-2/FLU/RSV testing.  Fact Sheet for Patients: EntrepreneurPulse.com.au  Fact Sheet for Healthcare Providers: IncredibleEmployment.be  This test is not yet approved or cleared by the [place] FDA and has been authorized for detection and/or diagnosis of SARS-CoV-2  by FDA under an Emergency Use Authorization (EUA). This EUA will remain in effect (meaning this test can be used) for the duration of the COVID-19 declaration under Section 564(b)(1) of the Act, 21 U.S.C. section 360bbb-3(b)(1), unless the authorization is terminated or revoked.  Performed at [facility], [place] [address], [place], [place] [address]     Lab Basic Metabolic Panel: Recent Labs  Lab 01/29/20 1951 01/30/20 0554 01/31/20 0613 02/09/2020 1011  NA 139 140 143 144  K 4.3 3.4* 4.3 3.6  CL 106 107 111 112*  CO2 22 22 20* 22  GLUCOSE 122* 98 85 114*  BUN 66* 58* 54* 55*  CREATININE 2.99* 2.63* 2.38* 2.33*  CALCIUM 8.8* 8.5* 8.7* 8.7*   Liver Function Tests: Recent Labs  Lab 01/29/20 1951 01/20/2020 1011  AST 27 22  ALT 17 16  ALKPHOS 173* 162*  BILITOT 0.6 1.5*  PROT 5.9* 6.1*  ALBUMIN 2.6* 2.5*   No results for input(s): LIPASE, AMYLASE in the last 168 hours. Recent Labs  Lab 01/30/20 0554  AMMONIA 16   CBC: Recent Labs  Lab 01/29/20 1951 01/30/20 0554 01/31/20 0613 01/28/2020 1011  WBC 8.9 9.3 8.9 10.2  NEUTROABS 6.0  --   --  6.9  HGB 9.0* 8.5* 8.7* 9.2*  HCT 29.0* 28.4* 29.7* 31.5*  MCV 96.0 96.3 97.4 97.5  PLT 176 199 176 203   Cardiac Enzymes: No results for input(s): CKTOTAL, CKMB, CKMBINDEX, TROPONINI in the last 168 hours. Sepsis Labs: Recent Labs  Lab 01/29/20 1951 01/30/20 0554 01/31/20 0613 02/09/2020 1011  WBC 8.9 9.3 8.9 10.2       Dessa Phi 02/29/2020, 10:29  AM

## 2020-02-18 NOTE — Plan of Care (Signed)
  Problem: Coping: Goal: Level of anxiety will decrease Outcome: Progressing   Problem: Pain Managment: Goal: General experience of comfort will improve Outcome: Progressing   

## 2020-02-18 DEATH — deceased

## 2020-03-16 DIAGNOSIS — N189 Chronic kidney disease, unspecified: Secondary | ICD-10-CM | POA: Diagnosis not present

## 2020-03-16 DIAGNOSIS — I10 Essential (primary) hypertension: Secondary | ICD-10-CM | POA: Diagnosis not present

## 2020-03-16 DIAGNOSIS — I4891 Unspecified atrial fibrillation: Secondary | ICD-10-CM | POA: Diagnosis not present

## 2022-02-07 IMAGING — CR DG CHEST 2V
2 series · 2 of 2 positions shown · non-contrast
Comparison: None.

CLINICAL DATA: Patient had 3 unwitnessed falls in the last 57hrs.
Cognitive and physical decline x 3 weeks. Recent dx of pancreatic
CA. Pt's wife in room at [REDACTED] tested COVID (+)

EXAM:
CHEST - 2 VIEW

[x chest ap]
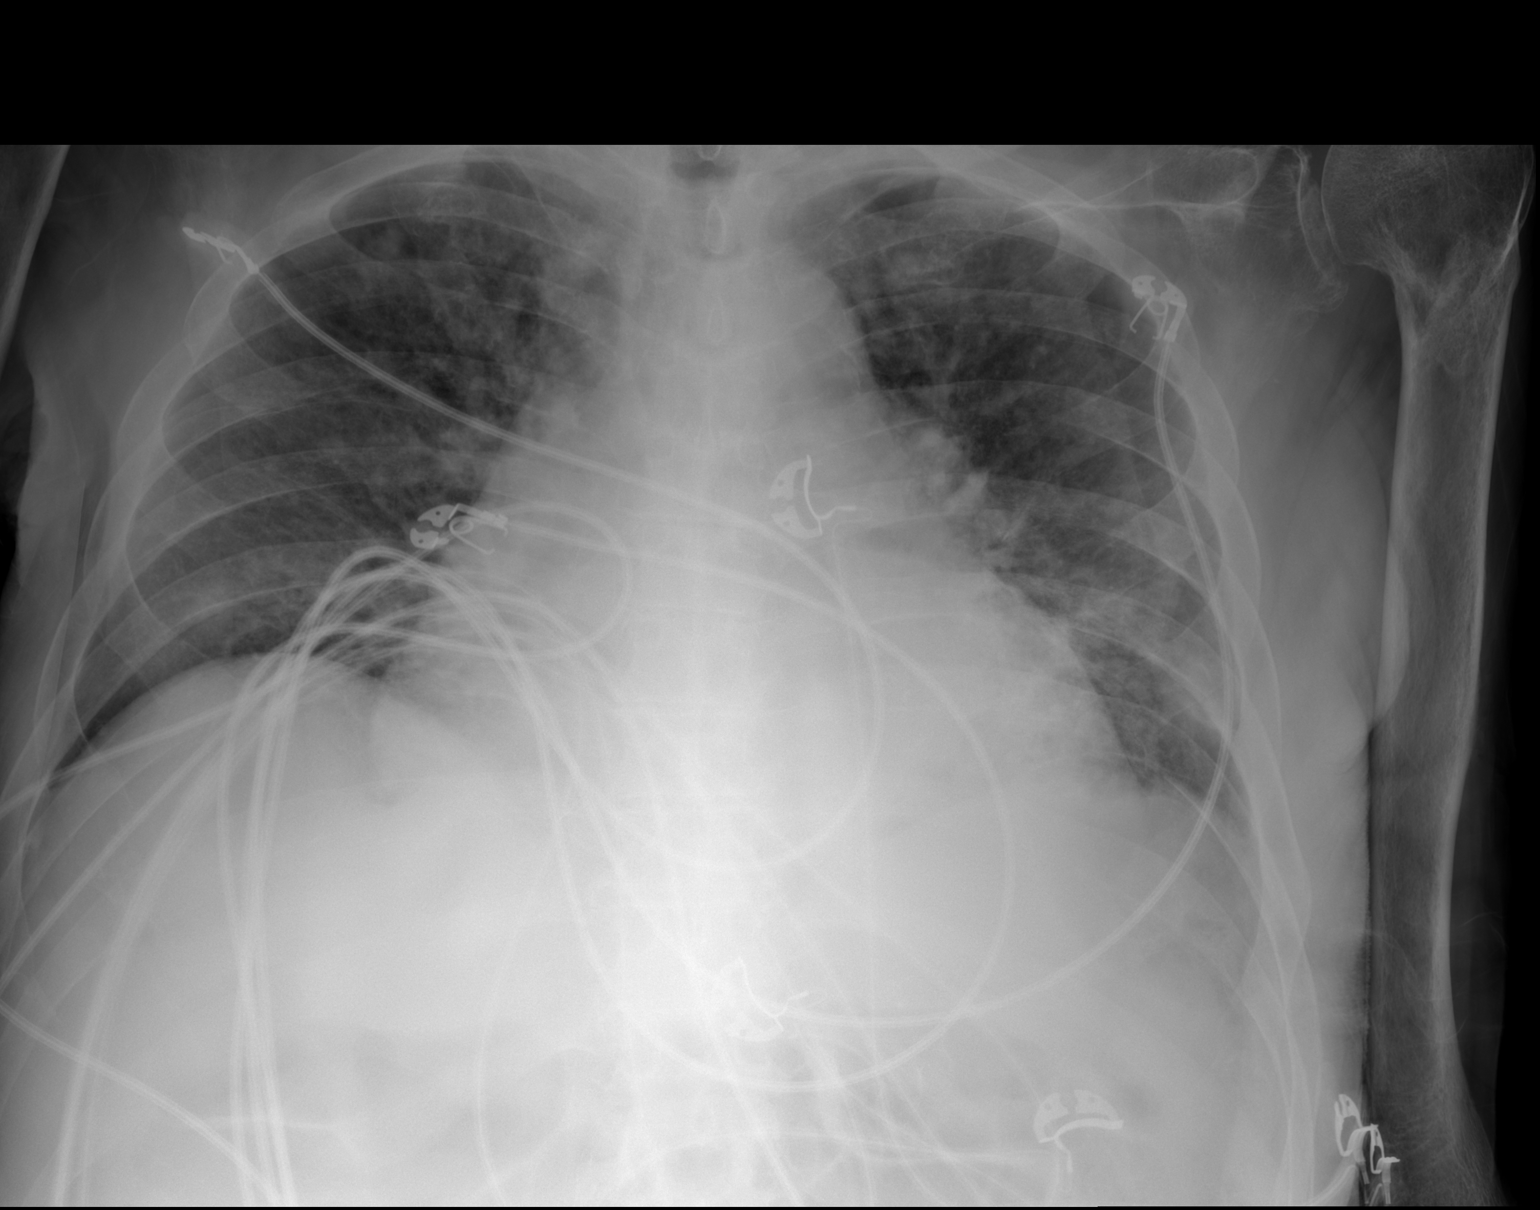

[w chest lat]
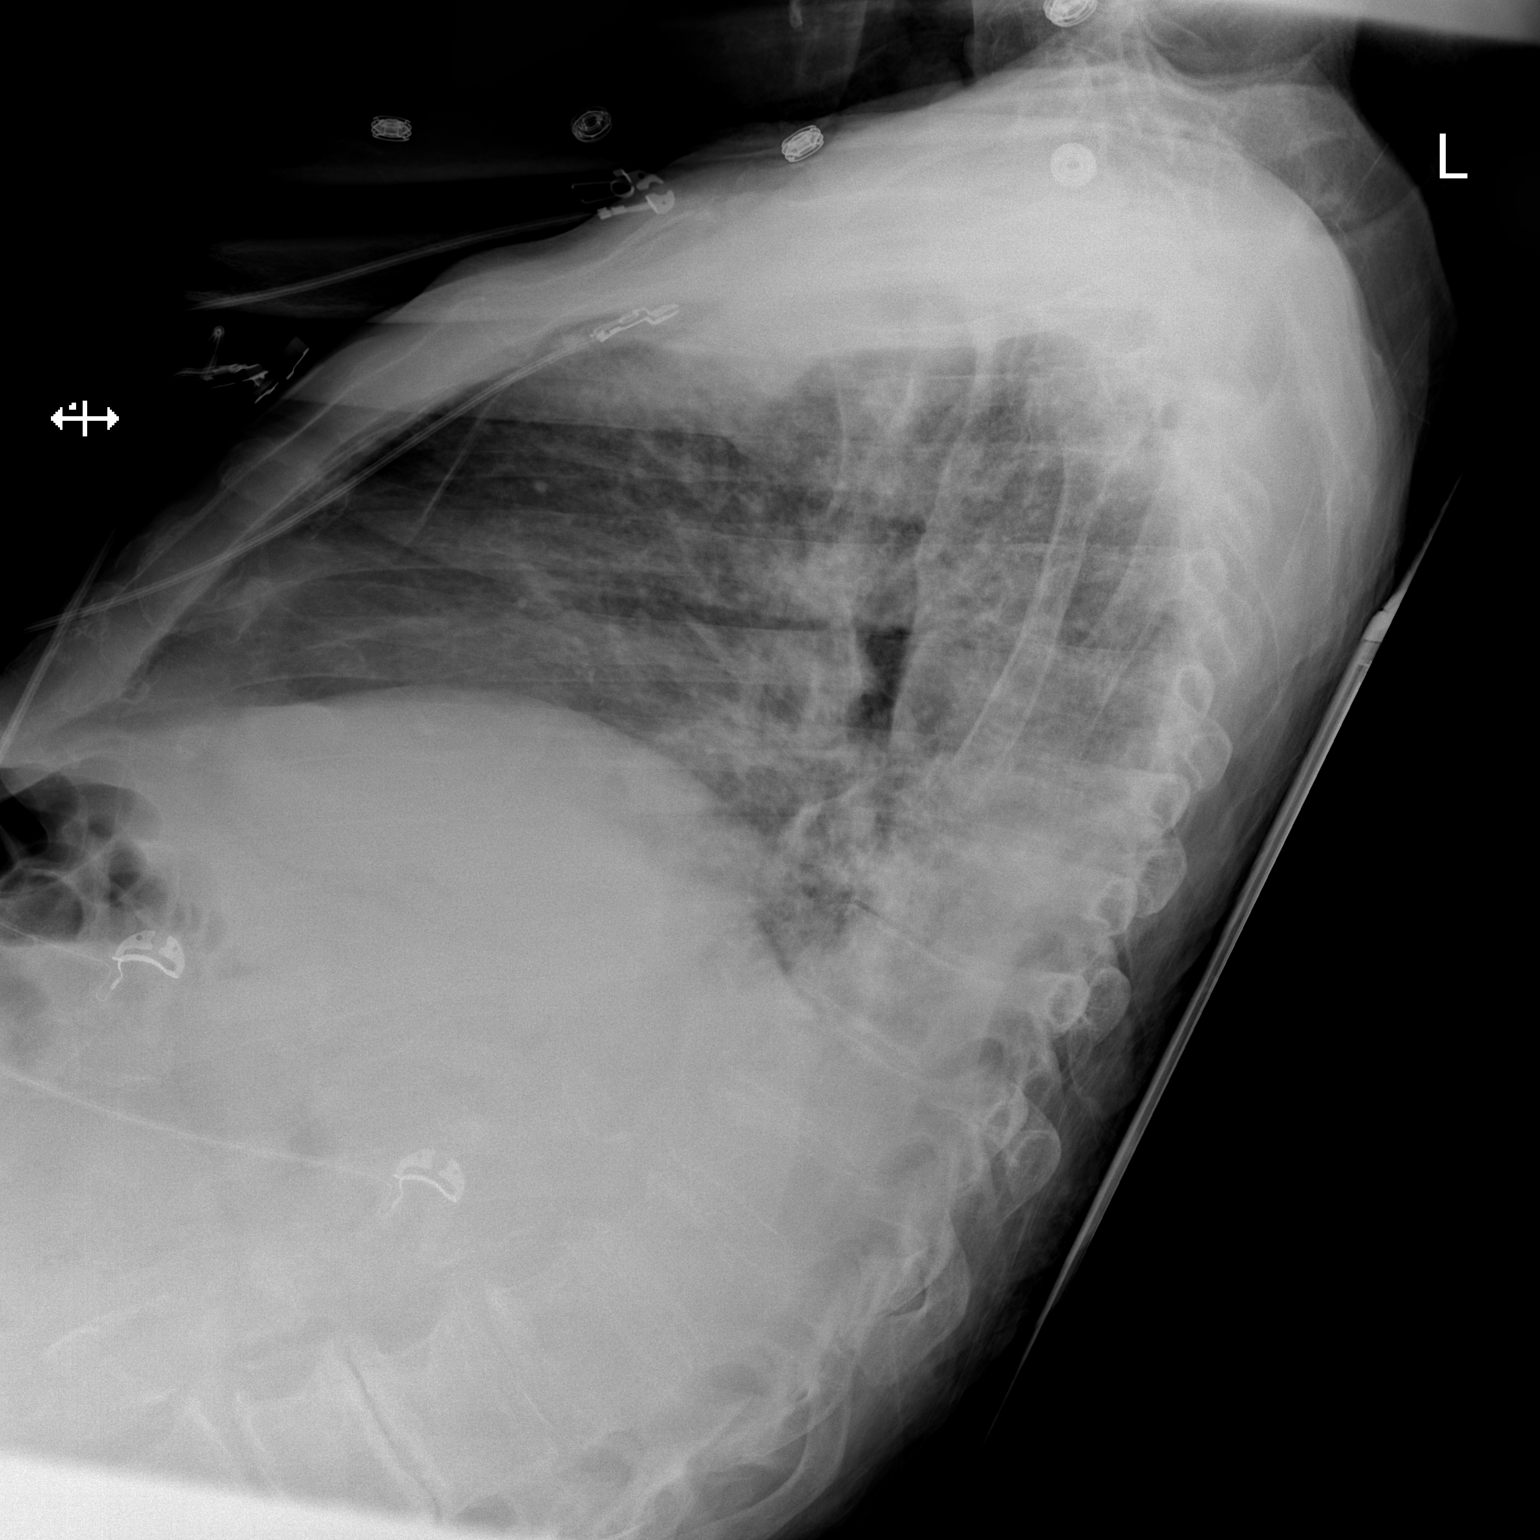

[2 of 2 positions shown; findings below may reference images not displayed]

FINDINGS: Cardiomegaly. Low lung volumes. There are diffuse bilateral airspace
opacities concerning for multifocal infection or edema. No
pneumothorax or large pleural effusion. No acute finding in the
visualized skeleton.
IMPRESSION: Cardiomegaly. Diffuse bilateral airspace opacities which are
nonspecific but concerning for multifocal infection or edema.

## 2022-02-07 IMAGING — CT CT CERVICAL SPINE W/O CM
4 of 5 series · 14 of 33 positions shown, 16 images · non-contrast
Comparison: None.

CLINICAL DATA: Multiple falls.

EXAM:
CT HEAD WITHOUT CONTRAST
CT CERVICAL SPINE WITHOUT CONTRAST
TECHNIQUE: Multidetector CT imaging of the head and cervical spine was
performed following the standard protocol without intravenous
contrast. Multiplanar CT image reconstructions of the cervical spine
were also generated.

[Series 6: orthogonal bone · axial · 0.23mm/px · z∈[+1422,+1523]mm · 3 of 123 slices shown, 4 images (1 of 2)]
[im 31/123  soft-tissue]
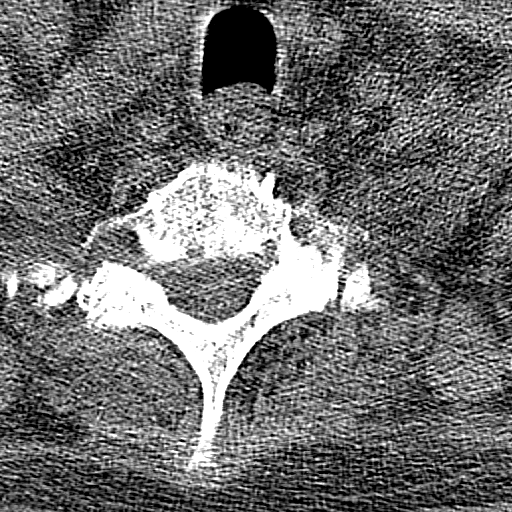
[im 31/123  bone]
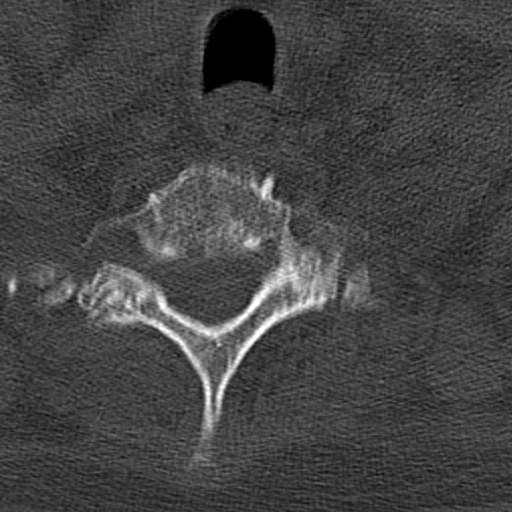
[im 62/123  bone]
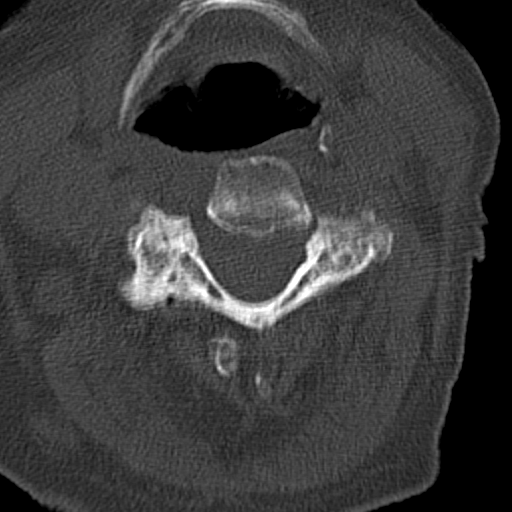
[im 92/123  bone]
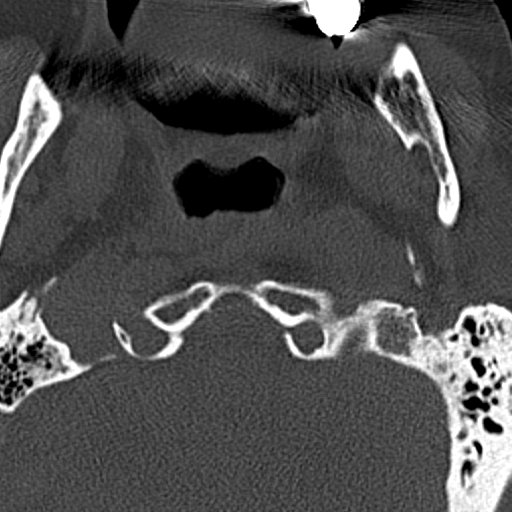

[Series 7: coronal bone · coronal · 0.29mm/px · 3 of 61 slices shown]
[im 14/61  bone]
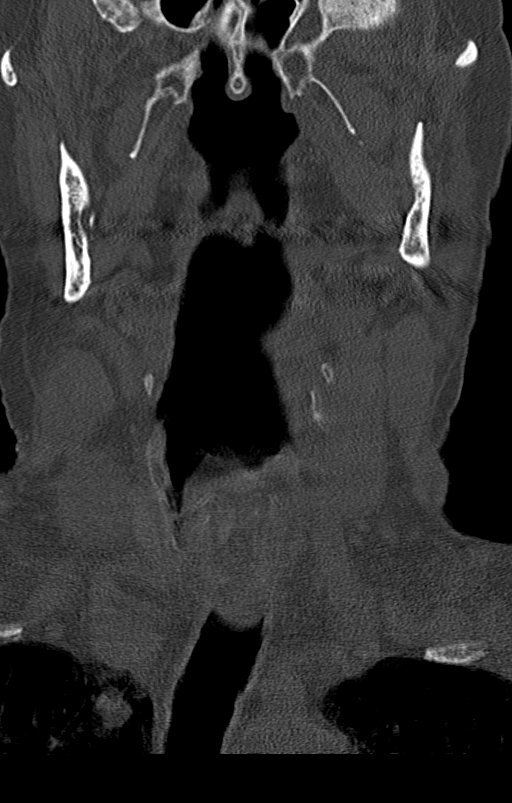
[im 25/61  bone]
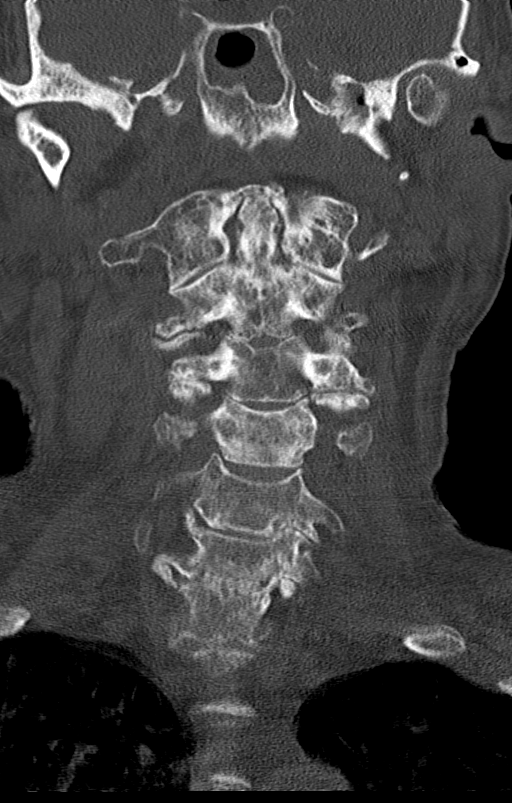
[im 36/61  bone]
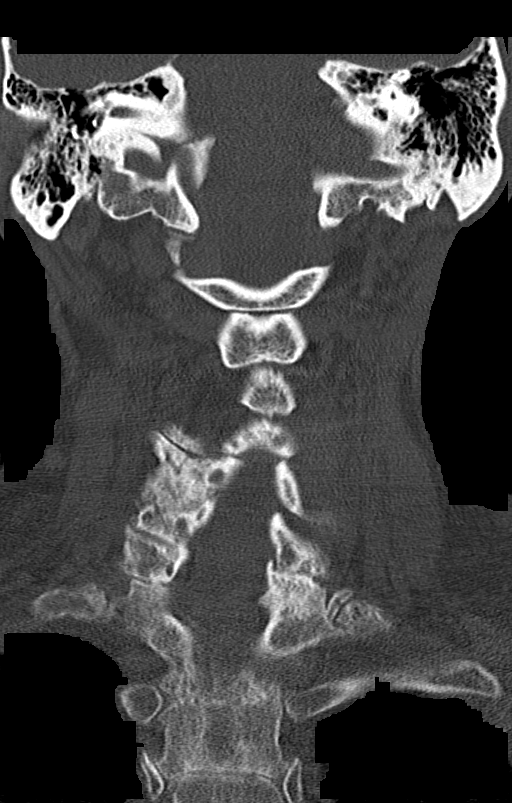

[Series 8: sagittal bone · sagittal · 0.38mm/px · 5 of 61 slices shown, 6 images]
[im 21/61  bone]
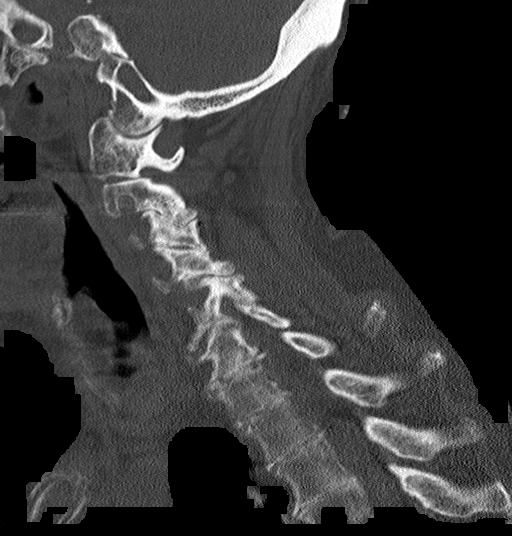
[im 26/61  bone]
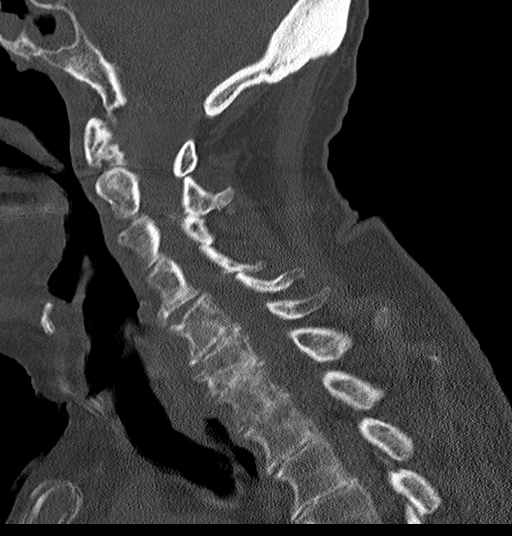
[im 31/61  soft-tissue]
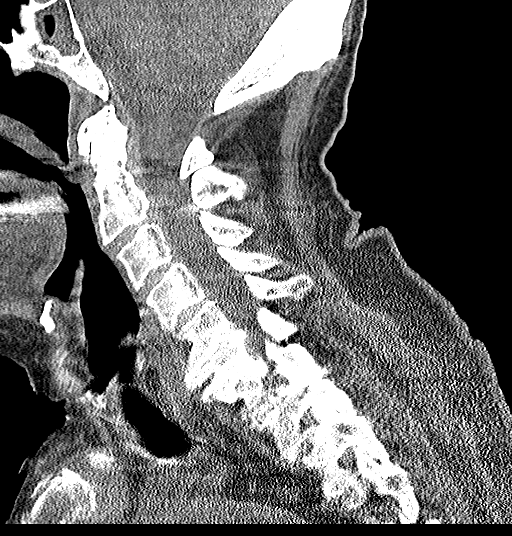
[im 31/61  bone]
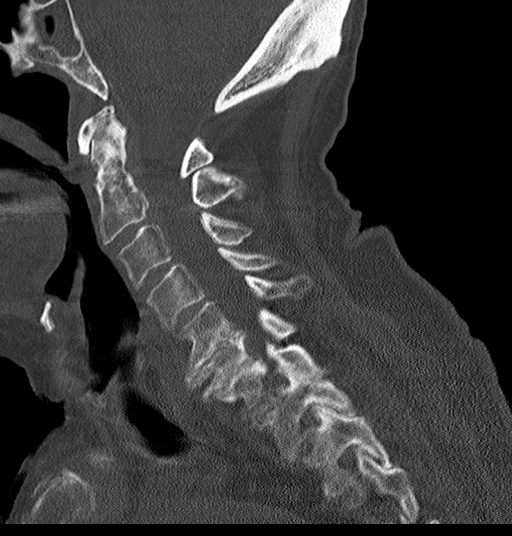
[im 36/61  bone]
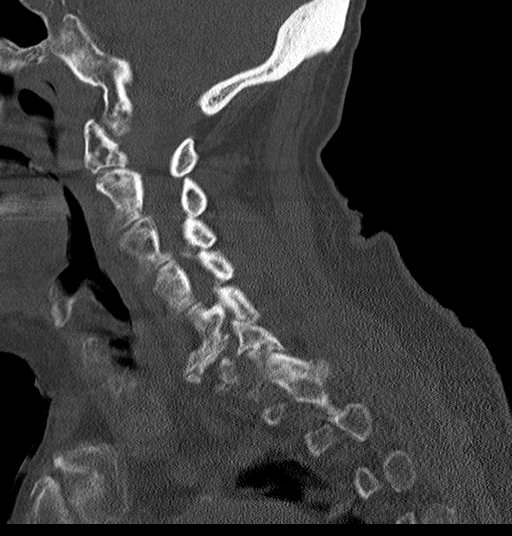
[im 41/61  bone]
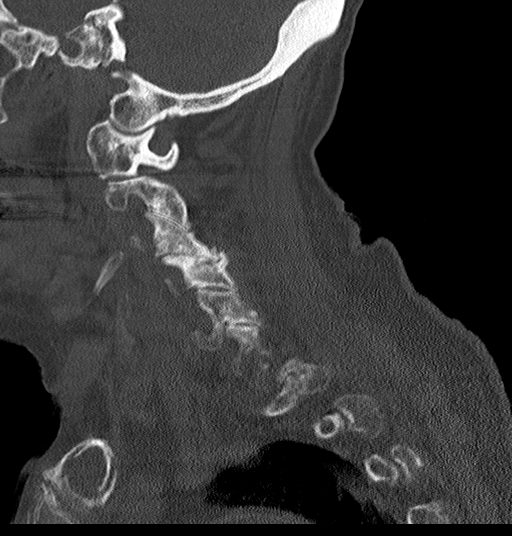

[Series 9: orthogonal bone · axial · 0.32mm/px · z∈[+1410,+1510]mm · 3 of 123 slices shown (2 of 2)]
[im 31/123  bone]
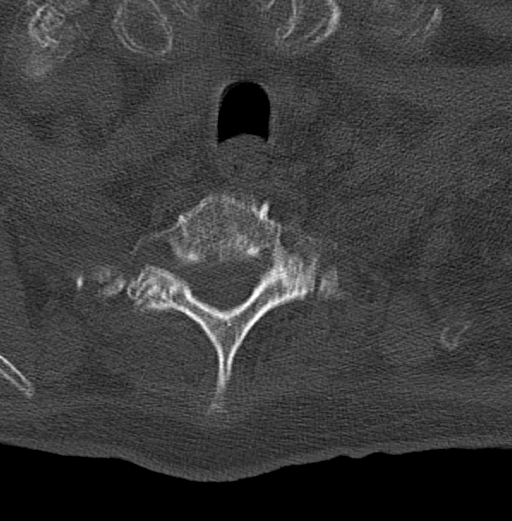
[im 62/123  bone]
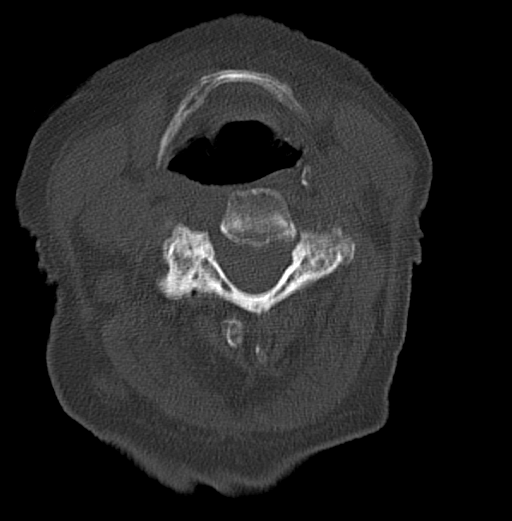
[im 92/123  bone]
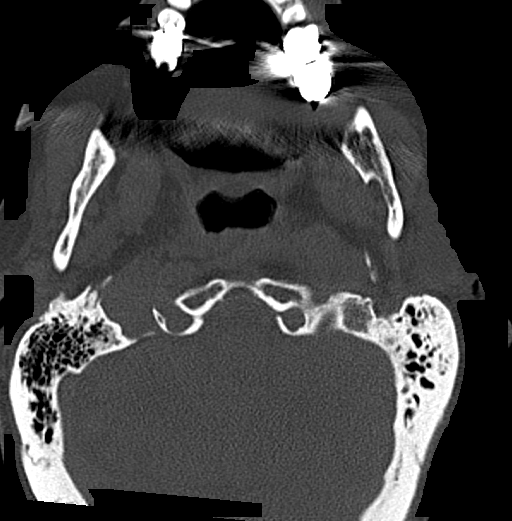

[14 of 33 positions shown; findings below may reference images not displayed]

FINDINGS: CT HEAD FINDINGS

Brain: Mild diffuse cortical atrophy is noted. No mass effect or
midline shift is noted. Ventricular size is within normal limits.
There is no evidence of mass lesion, hemorrhage or acute infarction.

Vascular: No hyperdense vessel or unexpected calcification.

Skull: Normal. Negative for fracture or focal lesion.

Sinuses/Orbits: Right sphenoid sinusitis is noted.

Other: None.

CT CERVICAL SPINE FINDINGS

Alignment: Normal.

Skull base and vertebrae: No acute fracture. No primary bone lesion
or focal pathologic process.

Soft tissues and spinal canal: No prevertebral fluid or swelling. No
visible canal hematoma.

Disc levels: Severe degenerative disc disease is noted at C5-6 and
C6-7.

Upper chest: Multiple nodules are seen in the visualized lung fields
consistent with metastatic disease.

Other: None.
IMPRESSION: 1. Mild diffuse cortical atrophy. Right sphenoid sinusitis. No acute
intracranial abnormality seen.
2. Severe multilevel degenerative disc disease. No acute abnormality
seen in the cervical spine.
3. Multiple nodules are noted in the visualized lung fields
consistent with metastatic disease.
